# Patient Record
Sex: Male | Born: 1975 | Race: White | Hispanic: No | Smoking: Never smoker
Health system: Southern US, Community
[De-identification: ages and names within clinical notes are randomized; demographics above are authoritative.]

## PROBLEM LIST (undated history)

## (undated) DIAGNOSIS — M1990 Unspecified osteoarthritis, unspecified site: Secondary | ICD-10-CM

## (undated) DIAGNOSIS — E119 Type 2 diabetes mellitus without complications: Secondary | ICD-10-CM

## (undated) DIAGNOSIS — E785 Hyperlipidemia, unspecified: Secondary | ICD-10-CM

## (undated) DIAGNOSIS — I10 Essential (primary) hypertension: Secondary | ICD-10-CM

## (undated) DIAGNOSIS — N201 Calculus of ureter: Secondary | ICD-10-CM

## (undated) DIAGNOSIS — Z87442 Personal history of urinary calculi: Secondary | ICD-10-CM

## (undated) DIAGNOSIS — Z9989 Dependence on other enabling machines and devices: Secondary | ICD-10-CM

## (undated) DIAGNOSIS — G4733 Obstructive sleep apnea (adult) (pediatric): Secondary | ICD-10-CM

## (undated) DIAGNOSIS — Z973 Presence of spectacles and contact lenses: Secondary | ICD-10-CM

## (undated) HISTORY — PX: OTHER SURGICAL HISTORY: SHX169

---

## 2004-08-18 HISTORY — PX: OTHER SURGICAL HISTORY: SHX169

## 2010-07-12 ENCOUNTER — Encounter: Admission: RE | Admit: 2010-07-12 | Discharge: 2010-07-12 | Payer: Self-pay | Admitting: Neurosurgery

## 2010-08-14 ENCOUNTER — Encounter
Admission: RE | Admit: 2010-08-14 | Discharge: 2010-08-14 | Payer: Self-pay | Source: Home / Self Care | Attending: Neurosurgery | Admitting: Neurosurgery

## 2010-12-19 ENCOUNTER — Other Ambulatory Visit: Payer: Self-pay | Admitting: Neurosurgery

## 2010-12-19 DIAGNOSIS — M5410 Radiculopathy, site unspecified: Secondary | ICD-10-CM

## 2010-12-19 DIAGNOSIS — M5126 Other intervertebral disc displacement, lumbar region: Secondary | ICD-10-CM

## 2010-12-19 DIAGNOSIS — M549 Dorsalgia, unspecified: Secondary | ICD-10-CM

## 2010-12-23 ENCOUNTER — Ambulatory Visit
Admission: RE | Admit: 2010-12-23 | Discharge: 2010-12-23 | Disposition: A | Discharge status: Home / Self Care | Payer: BC Managed Care – PPO | Source: Ambulatory Visit | Attending: Neurosurgery | Admitting: Neurosurgery

## 2010-12-23 DIAGNOSIS — M5410 Radiculopathy, site unspecified: Secondary | ICD-10-CM

## 2010-12-23 DIAGNOSIS — M5126 Other intervertebral disc displacement, lumbar region: Secondary | ICD-10-CM

## 2010-12-23 DIAGNOSIS — M549 Dorsalgia, unspecified: Secondary | ICD-10-CM

## 2011-12-17 ENCOUNTER — Other Ambulatory Visit: Payer: Self-pay | Admitting: Neurosurgery

## 2011-12-17 DIAGNOSIS — M549 Dorsalgia, unspecified: Secondary | ICD-10-CM

## 2011-12-26 ENCOUNTER — Ambulatory Visit
Admission: RE | Admit: 2011-12-26 | Discharge: 2011-12-26 | Disposition: A | Discharge status: Home / Self Care | Payer: Managed Care, Other (non HMO) | Source: Ambulatory Visit | Attending: Neurosurgery | Admitting: Neurosurgery

## 2011-12-26 VITALS — BP 152/84 | HR 74

## 2011-12-26 DIAGNOSIS — M549 Dorsalgia, unspecified: Secondary | ICD-10-CM

## 2011-12-26 MED ORDER — IOHEXOL 180 MG/ML  SOLN
1.0000 mL | Freq: Once | INTRAMUSCULAR | Status: AC | PRN
  Administered 2011-12-26: 1 mL via EPIDURAL

## 2011-12-26 MED ORDER — METHYLPREDNISOLONE ACETATE 40 MG/ML INJ SUSP (RADIOLOG
120.0000 mg | Freq: Once | INTRAMUSCULAR | Status: AC
  Administered 2011-12-26: 120 mg via EPIDURAL

## 2011-12-26 NOTE — Discharge Instructions (Signed)

## 2013-05-13 ENCOUNTER — Ambulatory Visit (INDEPENDENT_AMBULATORY_CARE_PROVIDER_SITE_OTHER): Payer: Managed Care, Other (non HMO) | Admitting: Urology

## 2013-05-13 ENCOUNTER — Other Ambulatory Visit: Payer: Self-pay | Admitting: Urology

## 2013-05-13 DIAGNOSIS — N200 Calculus of kidney: Secondary | ICD-10-CM

## 2013-06-10 ENCOUNTER — Encounter (HOSPITAL_BASED_OUTPATIENT_CLINIC_OR_DEPARTMENT_OTHER): Payer: Self-pay | Admitting: *Deleted

## 2013-06-10 NOTE — Progress Notes (Addendum)
NPO AFTER MN. ARRIVE AT 0600. NEEDS KUB, EKG, AND ISTAT. WILL TAKE NORVASC AM DOS W/ SIPS OF WATER.  SPOKE W/ PT VIA PHONE. PT WAS NOTIFIED BY PAM AT DR [facility] OFFICE ABOUT SURGERY TIME  CHANGE TO 1500.  PT TO ARRIVE AT 1300. NPO AFTER MN WITH EXCEPTION CLEAR LIQUIDS UNTIL 0900 (NO CREAM/ MILK PRODUCTS).  SAME NEEDS AS ABOVE AND PT WILL TAKE NORVASC.

## 2013-06-15 NOTE — H&P (Signed)
ctive Problems Problems  1. Nephrolithiasis 592.0  History of Present Illness     Howard Rogers is a 37 yo WM who has a 5 mm left ureteral stone that presented at the end of July.   He had a stent placed then and was to have ESWL but because of Dr. Rudean Haskell schedule, it hasn't been done yet.   He is tolerating the stent well but has some bleeding at times.  He has had no prior stones.  He has no other GU history.   Past Medical History Problems  1. History of  Adult Sleep Apnea 780.57 2. History of  Diabetes Mellitus 250.00 3. History of  Hypercholesterolemia 272.0 4. History of  Hypertension 401.9  Surgical History Problems  1. History of  Cystoscopy With Insertion Of Ureteral Stent Left 2. History of  Eye Surgery  Current Meds 1. Amlodipine Besy-Benazepril HCl 10-20 MG Oral Capsule; Therapy: (Recorded:26Sep2014) to 2. Glimepiride 4 MG Oral Tablet; Therapy: (Recorded:26Sep2014) to 3. MetFORMIN HCl 1000 MG Oral Tablet; Therapy: (Recorded:26Sep2014) to 4. Simvastatin 40 MG Oral Tablet; Therapy: (Recorded:26Sep2014) to 5. Victoza 18 MG/3ML Subcutaneous Solution Pen-injector; Therapy: (Recorded:26Sep2014) to  Allergies Medication  1. Codeine Derivatives  Family History Problems  1. Family history of  Cancer 2. Family history of  Death In The Family Father prostate ca 3. Family history of  Diabetes Mellitus V18.0 4. 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  Genitourinary: urinary frequency, dysuria, nocturia and hematuria.  Gastrointestinal: heartburn and diarrhea.   Constitutional: feeling tired (fatigue).    Vitals Vital Signs [Data Includes: Last 1 Day]  26Sep2014 08:32AM  BMI Calculated: 37.93 BSA Calculated: 2.46 Height: 6 ft  Weight: 280 lb  Blood Pressure: 114 / 73 Temperature: 98.8 F Heart Rate: 80  Physical Exam Constitutional: Well nourished and well developed . No acute distress.  ENT:. The ears and nose are normal in appearance.  Pulmonary: No respiratory distress and normal respiratory rhythm and effort.  Cardiovascular: Heart rate and rhythm are normal . No peripheral edema.  Abdomen: The abdomen is obese. The abdomen is soft and nontender. No masses are palpated. No CVA tenderness. No hernias are palpable. No hepatosplenomegaly noted.  Lymphatics: The posterior cervical and supraclavicular nodes are not enlarged or tender.  Skin: Normal skin turgor, no visible rash and no visible skin lesions.  Neuro/Psych:. Mood and affect are appropriate. No motor deficits.    Results/Data  Old records or history reviewed: I have reviewed hospital and office records from Dr. Jerre Simon.  The following images/tracing/specimen were independently visualized:  I have reviewed his films from [place].  The following clinical lab reports were reviewed:  UA reviewed and it was nitrite negative with 3-6 WBC's and 11-20 RBC's.    Assessment Assessed  1. Nephrolithiasis 592.0   He has a left renal stone post stenting and needs definitive therapy.   Plan Nephrolithiasis (592.0)  1. UA With REFLEX  Requested for: 23Sep2014 2. Follow-up Schedule Surgery Office  Follow-up  Requested for: 26Sep2014      I discussed the options including stent removal with possible passage, ESWL and ureteroscopic extraction.   He would like to  proceed with ureteroscopy for the left renal stone and I reviewed the risks of bleeding, infection, ureteral injury, need for a stent or secondary procedures, thrombotic events and anesthetic complications. I asked him to hydrate  well and work on his weight which will help reduce the risk of stones and improve his other medical conditions.   Discussion/Summary  CC: Dr. Lia Hopping.

## 2013-06-16 ENCOUNTER — Ambulatory Visit (HOSPITAL_BASED_OUTPATIENT_CLINIC_OR_DEPARTMENT_OTHER)
Admission: RE | Admit: 2013-06-16 | Discharge: 2013-06-16 | Disposition: A | Discharge status: Home / Self Care | Payer: Managed Care, Other (non HMO) | Source: Ambulatory Visit | Attending: Urology | Admitting: Urology

## 2013-06-16 ENCOUNTER — Ambulatory Visit (HOSPITAL_BASED_OUTPATIENT_CLINIC_OR_DEPARTMENT_OTHER): Payer: Managed Care, Other (non HMO) | Admitting: Anesthesiology

## 2013-06-16 ENCOUNTER — Encounter (HOSPITAL_BASED_OUTPATIENT_CLINIC_OR_DEPARTMENT_OTHER): Payer: Self-pay | Admitting: *Deleted

## 2013-06-16 ENCOUNTER — Encounter (HOSPITAL_BASED_OUTPATIENT_CLINIC_OR_DEPARTMENT_OTHER): Payer: Managed Care, Other (non HMO) | Admitting: Anesthesiology

## 2013-06-16 ENCOUNTER — Ambulatory Visit (HOSPITAL_COMMUNITY): Payer: Managed Care, Other (non HMO)

## 2013-06-16 ENCOUNTER — Encounter (HOSPITAL_BASED_OUTPATIENT_CLINIC_OR_DEPARTMENT_OTHER)
Admission: RE | Disposition: A | Discharge status: Home / Self Care | Payer: Self-pay | Source: Ambulatory Visit | Attending: Urology

## 2013-06-16 DIAGNOSIS — K219 Gastro-esophageal reflux disease without esophagitis: Secondary | ICD-10-CM | POA: Insufficient documentation

## 2013-06-16 DIAGNOSIS — I10 Essential (primary) hypertension: Secondary | ICD-10-CM | POA: Insufficient documentation

## 2013-06-16 DIAGNOSIS — E119 Type 2 diabetes mellitus without complications: Secondary | ICD-10-CM | POA: Insufficient documentation

## 2013-06-16 DIAGNOSIS — N200 Calculus of kidney: Secondary | ICD-10-CM | POA: Insufficient documentation

## 2013-06-16 DIAGNOSIS — G4730 Sleep apnea, unspecified: Secondary | ICD-10-CM | POA: Insufficient documentation

## 2013-06-16 DIAGNOSIS — E7800 Pure hypercholesterolemia, unspecified: Secondary | ICD-10-CM | POA: Insufficient documentation

## 2013-06-16 HISTORY — PX: URETEROSCOPY: SHX842

## 2013-06-16 HISTORY — DX: Unspecified osteoarthritis, unspecified site: M19.90

## 2013-06-16 HISTORY — DX: Type 2 diabetes mellitus without complications: E11.9

## 2013-06-16 HISTORY — DX: Hyperlipidemia, unspecified: E78.5

## 2013-06-16 HISTORY — PX: HOLMIUM LASER APPLICATION: SHX5852

## 2013-06-16 HISTORY — DX: Essential (primary) hypertension: I10

## 2013-06-16 HISTORY — DX: Presence of spectacles and contact lenses: Z97.3

## 2013-06-16 HISTORY — DX: Dependence on other enabling machines and devices: Z99.89

## 2013-06-16 HISTORY — DX: Obstructive sleep apnea (adult) (pediatric): G47.33

## 2013-06-16 LAB — GLUCOSE, CAPILLARY: Glucose-Capillary: 134 mg/dL — ABNORMAL HIGH (ref 70–99)

## 2013-06-16 LAB — POCT I-STAT 4, (NA,K, GLUC, HGB,HCT)
Glucose, Bld: 116 mg/dL — ABNORMAL HIGH (ref 70–99)
Potassium: 3.9 mEq/L (ref 3.5–5.1)

## 2013-06-16 SURGERY — URETEROSCOPY
Anesthesia: General | Site: Ureter | Laterality: Left | Wound class: Clean Contaminated

## 2013-06-16 MED ORDER — ONDANSETRON HCL 4 MG/2ML IJ SOLN
4.0000 mg | Freq: Four times a day (QID) | INTRAMUSCULAR | Status: DC | PRN
  Filled 2013-06-16: qty 2

## 2013-06-16 MED ORDER — PROMETHAZINE HCL 25 MG/ML IJ SOLN
6.2500 mg | INTRAMUSCULAR | Status: DC | PRN
  Filled 2013-06-16: qty 1

## 2013-06-16 MED ORDER — CIPROFLOXACIN IN D5W 400 MG/200ML IV SOLN
400.0000 mg | INTRAVENOUS | Status: AC
  Administered 2013-06-16: 400 mg via INTRAVENOUS
  Filled 2013-06-16: qty 200

## 2013-06-16 MED ORDER — HYDROMORPHONE HCL 2 MG PO TABS: 2.0000 mg | ORAL_TABLET | ORAL | Status: DC | PRN

## 2013-06-16 MED ORDER — PROPOFOL 10 MG/ML IV BOLUS
INTRAVENOUS | Status: DC | PRN
  Administered 2013-06-16: 300 mg via INTRAVENOUS

## 2013-06-16 MED ORDER — MIDAZOLAM HCL 5 MG/5ML IJ SOLN
INTRAMUSCULAR | Status: DC | PRN
  Administered 2013-06-16 (×2): 1 mg via INTRAVENOUS

## 2013-06-16 MED ORDER — LACTATED RINGERS IV SOLN
INTRAVENOUS | Status: DC
  Administered 2013-06-16: 14:00:00 via INTRAVENOUS
  Filled 2013-06-16: qty 1000

## 2013-06-16 MED ORDER — 0.9 % SODIUM CHLORIDE (POUR BTL) OPTIME
TOPICAL | Status: DC | PRN
  Administered 2013-06-16: 1000 mL

## 2013-06-16 MED ORDER — SODIUM CHLORIDE 0.9 % IJ SOLN
3.0000 mL | INTRAMUSCULAR | Status: DC | PRN
  Filled 2013-06-16: qty 3

## 2013-06-16 MED ORDER — KETOROLAC TROMETHAMINE 30 MG/ML IJ SOLN
INTRAMUSCULAR | Status: DC | PRN
  Administered 2013-06-16: 30 mg via INTRAVENOUS

## 2013-06-16 MED ORDER — ONDANSETRON HCL 4 MG/2ML IJ SOLN
INTRAMUSCULAR | Status: DC | PRN
  Administered 2013-06-16: 4 mg via INTRAVENOUS

## 2013-06-16 MED ORDER — SODIUM CHLORIDE 0.9 % IV SOLN
250.0000 mL | INTRAVENOUS | Status: DC | PRN
  Filled 2013-06-16: qty 250

## 2013-06-16 MED ORDER — ACETAMINOPHEN 325 MG PO TABS
650.0000 mg | ORAL_TABLET | ORAL | Status: DC | PRN
  Filled 2013-06-16: qty 2

## 2013-06-16 MED ORDER — LIDOCAINE HCL (CARDIAC) 20 MG/ML IV SOLN
INTRAVENOUS | Status: DC | PRN
  Administered 2013-06-16: 100 mg via INTRAVENOUS

## 2013-06-16 MED ORDER — BELLADONNA ALKALOIDS-OPIUM 16.2-60 MG RE SUPP
RECTAL | Status: DC | PRN
  Administered 2013-06-16: 1 via RECTAL

## 2013-06-16 MED ORDER — FENTANYL CITRATE 0.05 MG/ML IJ SOLN
25.0000 ug | INTRAMUSCULAR | Status: DC | PRN
  Filled 2013-06-16: qty 1

## 2013-06-16 MED ORDER — FENTANYL CITRATE 0.05 MG/ML IJ SOLN
INTRAMUSCULAR | Status: DC | PRN
  Administered 2013-06-16: 50 ug via INTRAVENOUS
  Administered 2013-06-16 (×6): 25 ug via INTRAVENOUS

## 2013-06-16 MED ORDER — LACTATED RINGERS IV SOLN
INTRAVENOUS | Status: DC | PRN
  Administered 2013-06-16 (×2): via INTRAVENOUS

## 2013-06-16 MED ORDER — ACETAMINOPHEN 650 MG RE SUPP
650.0000 mg | RECTAL | Status: DC | PRN
  Filled 2013-06-16: qty 1

## 2013-06-16 MED ORDER — OXYCODONE HCL 5 MG PO TABS
5.0000 mg | ORAL_TABLET | ORAL | Status: DC | PRN
  Filled 2013-06-16: qty 2

## 2013-06-16 MED ORDER — SODIUM CHLORIDE 0.9 % IJ SOLN
3.0000 mL | Freq: Two times a day (BID) | INTRAMUSCULAR | Status: DC
  Filled 2013-06-16: qty 3

## 2013-06-16 MED ORDER — SODIUM CHLORIDE 0.9 % IR SOLN
Status: DC | PRN
  Administered 2013-06-16: 6000 mL

## 2013-06-16 SURGICAL SUPPLY — 36 items
BAG DRAIN URO-CYSTO SKYTR STRL (DRAIN) ×2 IMPLANT
BASKET LASER NITINOL 1.9FR (BASKET) IMPLANT
BASKET STNLS GEMINI 4WIRE 3FR (BASKET) IMPLANT
BASKET ZERO TIP NITINOL 2.4FR (BASKET) ×4 IMPLANT
BRUSH URET BIOPSY 3F (UROLOGICAL SUPPLIES) IMPLANT
CANISTER SUCT LVC 12 LTR MEDI- (MISCELLANEOUS) ×2 IMPLANT
CATH URET 5FR 28IN CONE TIP (BALLOONS)
CATH URET 5FR 28IN OPEN ENDED (CATHETERS) ×2 IMPLANT
CATH URET 5FR 70CM CONE TIP (BALLOONS) IMPLANT
CLOTH BEACON ORANGE TIMEOUT ST (SAFETY) ×2 IMPLANT
DRAPE CAMERA CLOSED 9X96 (DRAPES) ×2 IMPLANT
ELECT REM PT RETURN 9FT ADLT (ELECTROSURGICAL)
ELECTRODE REM PT RTRN 9FT ADLT (ELECTROSURGICAL) IMPLANT
FIBER LASER FLEXIVA 1000 (UROLOGICAL SUPPLIES) IMPLANT
FIBER LASER FLEXIVA 200 (UROLOGICAL SUPPLIES) ×2 IMPLANT
FIBER LASER FLEXIVA 365 (UROLOGICAL SUPPLIES) IMPLANT
FIBER LASER FLEXIVA 550 (UROLOGICAL SUPPLIES) IMPLANT
GLOVE BIOGEL PI IND STRL 7.5 (GLOVE) ×3 IMPLANT
GLOVE BIOGEL PI INDICATOR 7.5 (GLOVE) ×3
GLOVE SURG SS PI 8.0 STRL IVOR (GLOVE) ×2 IMPLANT
GOWN STRL NON-REIN LRG LVL3 (GOWN DISPOSABLE) ×4 IMPLANT
GOWN STRL REIN XL XLG (GOWN DISPOSABLE) ×2 IMPLANT
GUIDEWIRE 0.038 PTFE COATED (WIRE) IMPLANT
GUIDEWIRE ANG ZIPWIRE 038X150 (WIRE) IMPLANT
GUIDEWIRE STR DUAL SENSOR (WIRE) ×2 IMPLANT
IV NS IRRIG 3000ML ARTHROMATIC (IV SOLUTION) ×4 IMPLANT
KIT BALLIN UROMAX 15FX10 (LABEL) IMPLANT
KIT BALLN UROMAX 15FX4 (MISCELLANEOUS) IMPLANT
KIT BALLN UROMAX 26 75X4 (MISCELLANEOUS)
NS IRRIG 500ML POUR BTL (IV SOLUTION) ×2 IMPLANT
OMNIPAQUE IMPLANT
PACK CYSTOSCOPY (CUSTOM PROCEDURE TRAY) ×2 IMPLANT
SET HIGH PRES BAL DIL (LABEL)
SHEATH ACCESS URETERAL 38CM (SHEATH) ×2 IMPLANT
SHEATH ACCESS URETERAL 54CM (SHEATH) IMPLANT
STENT URET 6FRX26 CONTOUR (STENTS) ×2 IMPLANT

## 2013-06-16 NOTE — Anesthesia Preprocedure Evaluation (Addendum)
Anesthesia Evaluation  Patient identified by MRN, date of birth, ID band Patient awake    Reviewed: Allergy & Precautions, H&P , NPO status , Patient's Chart, lab work & pertinent test results  Airway Mallampati: II TM Distance: >3 FB Neck ROM: Full    Dental no notable dental hx.    Pulmonary sleep apnea and Continuous Positive Airway Pressure Ventilation ,  breath sounds clear to auscultation  Pulmonary exam normal       Cardiovascular Exercise Tolerance: Good hypertension, Pt. on medications negative cardio ROS  Rhythm:Regular Rate:Normal     Neuro/Psych negative neurological ROS  negative psych ROS   GI/Hepatic negative GI ROS, Neg liver ROS, GERD-  Medicated,  Endo/Other  diabetes, Type 2, Oral Hypoglycemic Agents  Renal/GU Renal diseasenegative Renal ROS  negative genitourinary   Musculoskeletal negative musculoskeletal ROS (+)   Abdominal (+) + obese,   Peds negative pediatric ROS (+)  Hematology negative hematology ROS (+)   Anesthesia Other Findings   Reproductive/Obstetrics negative OB ROS                          Anesthesia Physical Anesthesia Plan  ASA: III  Anesthesia Plan: General   Post-op Pain Management:    Induction: Intravenous  Airway Management Planned: LMA  Additional Equipment:   Intra-op Plan:   Post-operative Plan: Extubation in OR  Informed Consent: I have reviewed the patients History and Physical, chart, labs and discussed the procedure including the risks, benefits and alternatives for the proposed anesthesia with the patient or authorized representative who has indicated his/her understanding and acceptance.   Dental advisory given  Plan Discussed with: CRNA  Anesthesia Plan Comments: (Outpatient surgery in [place] in July and did fine. He has been on CPAP for about six weeks now with good result.)      Anesthesia Quick Evaluation

## 2013-06-16 NOTE — OR Nursing (Signed)
At 1532 left stent removed.

## 2013-06-16 NOTE — Brief Op Note (Signed)
06/16/2013  4:24 PM  PATIENT:  Sherron Ales  37 y.o. male  PRE-OPERATIVE DIAGNOSIS:  Left Renal Stone  POST-OPERATIVE DIAGNOSIS:  Left Renal Stone  PROCEDURE:  Procedure(s): URETEROSCOPY, LEFT URETERAL STENT REMOVAL, LEFT URETERAL STONE EXTRACTION WITH NITINOL BASKET (Left)  LEFT STONE HOLMIUM LASER APPLICATION (Left)  SURGEON:  Surgeon(s) and Role:    * Bjorn Pippin, MD - Primary  PHYSICIAN ASSISTANT:   ASSISTANTS: none   ANESTHESIA:   general  EBL:  Total I/O In: 1000 [I.V.:1000] Out: -   BLOOD ADMINISTERED:none  DRAINS: left 6x26jj stent   LOCAL MEDICATIONS USED:  NONE  SPECIMEN:  Source of Specimen:  stone fragments  DISPOSITION OF SPECIMEN:  to family  COUNTS:  YES  TOURNIQUET:  * No tourniquets in log *  DICTATION: .Other Dictation: Dictation Number [id number]  PLAN OF CARE: Discharge to home after PACU  PATIENT DISPOSITION:  PACU - hemodynamically stable.   Delay start of Pharmacological VTE agent (>24hrs) due to surgical blood loss or risk of bleeding: not applicable

## 2013-06-16 NOTE — OR Nursing (Signed)
Left ureteral stone taking by Dr. Annabell Howells.

## 2013-06-16 NOTE — Anesthesia Procedure Notes (Addendum)
Procedure Name: LMA Insertion Date/Time: 06/16/2013 3:22 PM Performed by: Jessica Priest Pre-anesthesia Checklist: Patient identified, Emergency Drugs available, Suction available and Patient being monitored Patient Re-evaluated:Patient Re-evaluated prior to inductionOxygen Delivery Method: Circle System Utilized Preoxygenation: Pre-oxygenation with 100% oxygen Intubation Type: IV induction Ventilation: Mask ventilation without difficulty LMA: LMA with gastric port inserted LMA Size: 5.0 Number of attempts: 1 Placement Confirmation: positive ETCO2 Tube secured with: Tape Dental Injury: Teeth and Oropharynx as per pre-operative assessment     Oral bite block placed on right side mouth - noted small sore on bottom lip ( as preop status) - LB

## 2013-06-16 NOTE — Transfer of Care (Signed)
Immediate Anesthesia Transfer of Care Note  Patient: Gibran Veselka  Procedure(s) Performed: Procedure(s) (LRB): URETEROSCOPY, LEFT URETERAL STENT REMOVAL, LEFT URETERAL STONE EXTRACTION WITH NITINOL BASKET (Left)  LEFT STONE HOLMIUM LASER APPLICATION (Left)  Patient Location: PACU  Anesthesia Type: General  Level of Consciousness: awake, sedated, patient cooperative and responds to stimulation  Airway & Oxygen Therapy: Patient Spontanous Breathing and Patient connected to face mask oxygen  Post-op Assessment: Report given to PACU RN, Post -op Vital signs reviewed and stable and Patient moving all extremities  Post vital signs: Reviewed and stable  Complications: No apparent anesthesia complications

## 2013-06-16 NOTE — Interval H&P Note (Signed)
History and Physical Interval Note:  06/16/2013 3:06 PM  Howard Rogers  has presented today for surgery, with the diagnosis of Left Renal Stone  The various methods of treatment have been discussed with the patient and family. After consideration of risks, benefits and other options for treatment, the patient has consented to  Procedure(s): URETEROSCOPY (Left) HOLMIUM LASER APPLICATION (Left) as a surgical intervention .  The patient's history has been reviewed, patient examined, no change in status, stable for surgery.  I have reviewed the patient's chart and labs.  Questions were answered to the patient's satisfaction.     Oluwakemi Salsberry J

## 2013-06-17 NOTE — Op Note (Signed)
NAME:  Howard Rogers, Howard Rogers                  ACCOUNT NO.:  MEDICAL RECORD NO.:  [id number]  LOCATION:                                 FACILITY:  PHYSICIAN:  Excell Seltzer. Annabell Howells, M.D.         DATE OF BIRTH:  DATE OF PROCEDURE:  06/16/2013 DATE OF DISCHARGE:                              OPERATIVE REPORT   PROCEDURE:  Left ureteroscopic stone extraction with holmium lasertripsy placed on left double-J stent.  PREOPERATIVE DIAGNOSIS:  A 5-mm left renal stone.  POSTOPERATIVE DIAGNOSIS:  holmium laser trips.  SURGEON:  Excell Seltzer. Annabell Howells, MD  ANESTHESIA:  General.  SPECIMEN:  Stone fragments.  DRAINS:  A 6-French 26-cm double-J stent with string.  BLOOD LOSS:  Minimal.  COMPLICATIONS:  None.  INDICATIONS:  Howard Rogers is a 37 year old white male, who recently had a 5-mm proximal stone and underwent stenting in July.  He now presents for definitive ureteroscopy.  FINDINGS OF PROCEDURES:  He was given Cipro.  He was taken to the operating room where general anesthetic was induced.  He was placed in lithotomy position and fitted with PAS hose.  Perineum and genitalia were prepped with Betadine solution and draped in usual sterile fashion.  Cystoscopy was performed using a 22-French scope and 12-degree lens. Examination revealed a normal urethra.  The prostatic urethra was short without obstruction.  Examination of bladder revealed mild trabeculation.  No tumors or stones were noted in the left ureteral orifice.  The right ureteral orifice was normal.  The left ureteral orifice had a stent that had incrustation and moderate edema.  The stent was grasped with grasping forceps, pulled through the urethral meatus and a wire was passed.  An attempt was made to pass a wire to the kidney.  However, the stent was obstructed so it was removed and then I was able to advance the wire through the ureteral orifice to the kidney without difficulty.  Some calcifications that broke from the stent  were evacuated from the bladder.  The cystoscope was removed and a 38-cm digital access sheath was passed over the wire to the proximal ureter.  The inner core and wire were removed and a digital flexible ureteroscope was then passed to the kidney.  I was able to negotiate the scope into the lower calyx where the stone was present.  The stone was under the lip of the calyx. Multiple attempts were made to basket the stone including flushing it with a high-pressure  syringe which moved into the mid calix but it was smooth enough that it did not engage the basket.  At this point, a 200 micron laser fiber was passed and was set on 1 joule and 5 hertz and the stone was engaged and was broken into manageable fragments.  A Nitinol basket was then passed again and 2 large fragments were removed, a single small fragment remained but was inaccessible due to the location.  At this point, the ureteroscope was removed.  The wire was placed through the access sheath which was then removed and a 6-French 26 cm double-J stent with string was passed over the wire to the kidney under fluoroscopic  guidance.  The wire was removed leaving good coil in the bladder.  The stone appeared to be in the distal ureter.  The cystoscope was reinserted alongside the wire.  The string was then pulled bringing the distal loop into the bladder.  At this point, the bladder was drained.  The cystoscope was removed. The stent string was left exiting the urethra.  The patient was taken down from lithotomy position.  His anesthetic was reversed.  He was moved to recovery room in stable condition.  There were no complications.     Excell Seltzer. Annabell Howells, M.D.     JJW/MEDQ  D:  06/16/2013  T:  06/17/2013  Job:  [id number]

## 2013-06-17 NOTE — Anesthesia Postprocedure Evaluation (Signed)
  Anesthesia Post-op Note  Patient: Howard Rogers  Procedure(s) Performed: Procedure(s) (LRB): URETEROSCOPY, LEFT URETERAL STENT REMOVAL, LEFT URETERAL STONE EXTRACTION WITH NITINOL BASKET (Left)  LEFT STONE HOLMIUM LASER APPLICATION (Left)  Patient Location: PACU  Anesthesia Type: General  Level of Consciousness: awake and alert   Airway and Oxygen Therapy: Patient Spontanous Breathing  Post-op Pain: mild  Post-op Assessment: Post-op Vital signs reviewed, Patient's Cardiovascular Status Stable, Respiratory Function Stable, Patent Airway and No signs of Nausea or vomiting  Last Vitals:  Filed Vitals:   06/16/13 1837  BP: 122/85  Pulse: 77  Temp: 36.1 C  Resp: 18    Post-op Vital Signs: stable   Complications: No apparent anesthesia complications

## 2013-06-20 ENCOUNTER — Encounter (HOSPITAL_BASED_OUTPATIENT_CLINIC_OR_DEPARTMENT_OTHER): Payer: Self-pay | Admitting: Urology

## 2013-07-01 ENCOUNTER — Ambulatory Visit (INDEPENDENT_AMBULATORY_CARE_PROVIDER_SITE_OTHER): Payer: Managed Care, Other (non HMO) | Admitting: Urology

## 2013-07-01 ENCOUNTER — Encounter (INDEPENDENT_AMBULATORY_CARE_PROVIDER_SITE_OTHER): Payer: Self-pay

## 2013-07-01 DIAGNOSIS — N200 Calculus of kidney: Secondary | ICD-10-CM

## 2013-12-23 ENCOUNTER — Ambulatory Visit (HOSPITAL_COMMUNITY)
Admission: RE | Admit: 2013-12-23 | Discharge: 2013-12-23 | Disposition: A | Discharge status: Home / Self Care | Payer: Managed Care, Other (non HMO) | Source: Ambulatory Visit | Attending: Urology | Admitting: Urology

## 2013-12-23 ENCOUNTER — Other Ambulatory Visit: Payer: Self-pay | Admitting: Urology

## 2013-12-23 DIAGNOSIS — N200 Calculus of kidney: Secondary | ICD-10-CM

## 2013-12-30 ENCOUNTER — Ambulatory Visit (INDEPENDENT_AMBULATORY_CARE_PROVIDER_SITE_OTHER): Payer: Managed Care, Other (non HMO) | Admitting: Urology

## 2013-12-30 DIAGNOSIS — N200 Calculus of kidney: Secondary | ICD-10-CM

## 2016-02-15 ENCOUNTER — Other Ambulatory Visit: Payer: Self-pay | Admitting: Internal Medicine

## 2016-02-15 DIAGNOSIS — M545 Low back pain, unspecified: Secondary | ICD-10-CM

## 2016-02-15 DIAGNOSIS — G8929 Other chronic pain: Secondary | ICD-10-CM

## 2016-02-20 ENCOUNTER — Ambulatory Visit
Admission: RE | Admit: 2016-02-20 | Discharge: 2016-02-20 | Disposition: A | Discharge status: Home / Self Care | Payer: 59 | Source: Ambulatory Visit | Attending: Internal Medicine | Admitting: Internal Medicine

## 2016-02-20 DIAGNOSIS — M545 Low back pain, unspecified: Secondary | ICD-10-CM

## 2016-02-20 DIAGNOSIS — G8929 Other chronic pain: Secondary | ICD-10-CM

## 2016-02-20 MED ORDER — IOPAMIDOL (ISOVUE-M 200) INJECTION 41%
1.0000 mL | Freq: Once | INTRAMUSCULAR | Status: AC
  Administered 2016-02-20: 1 mL via EPIDURAL

## 2016-02-20 MED ORDER — METHYLPREDNISOLONE ACETATE 40 MG/ML INJ SUSP (RADIOLOG
120.0000 mg | Freq: Once | INTRAMUSCULAR | Status: AC
  Administered 2016-02-20: 120 mg via EPIDURAL

## 2016-02-20 NOTE — Discharge Instructions (Signed)

## 2016-04-11 ENCOUNTER — Other Ambulatory Visit: Payer: Self-pay | Admitting: Urology

## 2016-04-11 ENCOUNTER — Ambulatory Visit (HOSPITAL_COMMUNITY)
Admission: RE | Admit: 2016-04-11 | Discharge: 2016-04-11 | Disposition: A | Discharge status: Home / Self Care | Payer: 59 | Source: Ambulatory Visit | Attending: Urology | Admitting: Urology

## 2016-04-11 ENCOUNTER — Ambulatory Visit (INDEPENDENT_AMBULATORY_CARE_PROVIDER_SITE_OTHER): Payer: 59 | Admitting: Urology

## 2016-04-11 DIAGNOSIS — N200 Calculus of kidney: Secondary | ICD-10-CM

## 2016-04-11 DIAGNOSIS — N132 Hydronephrosis with renal and ureteral calculous obstruction: Secondary | ICD-10-CM | POA: Diagnosis not present

## 2016-04-11 DIAGNOSIS — N281 Cyst of kidney, acquired: Secondary | ICD-10-CM | POA: Diagnosis not present

## 2016-05-02 ENCOUNTER — Ambulatory Visit (INDEPENDENT_AMBULATORY_CARE_PROVIDER_SITE_OTHER): Payer: 59 | Admitting: Urology

## 2016-05-02 ENCOUNTER — Other Ambulatory Visit: Payer: Self-pay | Admitting: Urology

## 2016-05-02 ENCOUNTER — Ambulatory Visit (HOSPITAL_COMMUNITY)
Admission: RE | Admit: 2016-05-02 | Discharge: 2016-05-02 | Disposition: A | Discharge status: Home / Self Care | Payer: 59 | Source: Ambulatory Visit | Attending: Urology | Admitting: Urology

## 2016-05-02 DIAGNOSIS — N201 Calculus of ureter: Secondary | ICD-10-CM | POA: Insufficient documentation

## 2016-05-02 DIAGNOSIS — N200 Calculus of kidney: Secondary | ICD-10-CM

## 2016-05-02 DIAGNOSIS — N281 Cyst of kidney, acquired: Secondary | ICD-10-CM | POA: Insufficient documentation

## 2016-05-02 DIAGNOSIS — K760 Fatty (change of) liver, not elsewhere classified: Secondary | ICD-10-CM | POA: Diagnosis not present

## 2016-05-02 DIAGNOSIS — N132 Hydronephrosis with renal and ureteral calculous obstruction: Secondary | ICD-10-CM | POA: Diagnosis not present

## 2016-05-09 ENCOUNTER — Other Ambulatory Visit: Payer: Self-pay | Admitting: Urology

## 2016-06-12 ENCOUNTER — Encounter (HOSPITAL_BASED_OUTPATIENT_CLINIC_OR_DEPARTMENT_OTHER): Payer: Self-pay | Admitting: *Deleted

## 2016-06-12 NOTE — Progress Notes (Addendum)
NPO AFTER MN.  ARRIVE AT 0600.  NEEDS ISTAT AND EKG.  WILL TAKE NORVASC AM DOS W/ SIPS OF WATER. WILL BRING CPAP.

## 2016-06-16 NOTE — Anesthesia Preprocedure Evaluation (Addendum)
Anesthesia Evaluation  Patient identified by MRN, date of birth, ID band Patient awake    Reviewed: Allergy & Precautions, H&P , Patient's Chart, lab work & pertinent test results, reviewed documented beta blocker date and time   Airway Mallampati: III  TM Distance: >3 FB Neck ROM: full    Dental no notable dental hx.    Pulmonary sleep apnea ,    Pulmonary exam normal breath sounds clear to auscultation       Cardiovascular hypertension,  Rhythm:regular Rate:Normal     Neuro/Psych    GI/Hepatic   Endo/Other  diabetes, Type 2, Oral Hypoglycemic Agents  Renal/GU      Musculoskeletal  (+) Arthritis , Osteoarthritis,    Abdominal   Peds  Hematology   Anesthesia Other Findings Last GA- Ventilation: Mask ventilation without difficulty LMA: LMA with gastric port inserted LMA Size: 5.0  Reproductive/Obstetrics                           Anesthesia Physical Anesthesia Plan  ASA: III  Anesthesia Plan:    Post-op Pain Management:    Induction: Intravenous  Airway Management Planned: LMA  Additional Equipment:   Intra-op Plan:   Post-operative Plan:   Informed Consent: I have reviewed the patients History and Physical, chart, labs and discussed the procedure including the risks, benefits and alternatives for the proposed anesthesia with the patient or authorized representative who has indicated his/her understanding and acceptance.   Dental Advisory Given and Dental advisory given  Plan Discussed with: CRNA and Surgeon  Anesthesia Plan Comments: (Discussed GA with LMA, possible sore throat, potential need to switch to ETT, N/V, pulmonary aspiration. Questions answered. )        Anesthesia Quick Evaluation

## 2016-06-16 NOTE — H&P (Signed)
CC: I have kidney stones.  HPI: Howard Rogers is a 40 year-old male established patient who is here for renal calculi.    Suezanne Jacquet returns today in f/u. He has a history of a right renal stone. He has had no pain over the last several weeks but a renal US showed residual hydro on 04/11/16 but a stone was not seen on KUB. His UA is clear today. He has no voiding complaints.      ALLERGIES: Codeine Derivatives    MEDICATIONS: Amlodipine Besy-Benazepril HCl - 10-20 MG Oral Capsule Oral  Glimepiride 4 MG Oral Tablet Oral  MetFORMIN HCl - 1000 MG Oral Tablet Oral  Simvastatin 40 MG Oral Tablet Oral  Victoza 18 MG/3ML Subcutaneous Solution Pen-injector Subcutaneous     GU PSH: Cysto Uretero Lithotripsy - 2014 Cystoscopy Insert Stent - 2014      PSH Notes: Cystoscopy With Insertion Of Ureteral Stent Left, Cystoscopy With Ureteroscopy With Lithotripsy, Eye Surgery   NON-GU PSH: None   GU PMH: Hydronephrosis with renal and ureteral calculous obstruction, Right, Mild to moderate right hydro about 2 weeks after the CT on MRI. - 04/11/2016 Kidney Stone, He had a 8mm mid renal stone on CT in June. - 04/11/2016, Kidney Stone, Nephrolithiasis - 2015 Renal Cysts, Simple, Right, This has been present since prior to 2014 and is only minimally increased to 5cm from 4.5cm in 2014. - 04/11/2016    NON-GU PMH: Personal history of other diseases of the circulatory system, History of hypertension - 2014 Personal history of other diseases of the nervous system and sense organs, History of sleep apnea - 2014 Personal history of other endocrine, nutritional and metabolic disease, History of hypercholesterolemia - 2014, History of diabetes mellitus, - 2014 Encounter for general adult medical examination without abnormal findings, Encounter for preventive health examination    FAMILY HISTORY: Cancer - Runs In Family Death In The Family Father - Runs In Family Diabetes - Runs In Family nephrolithiasis - Runs  In Family   SOCIAL HISTORY: None    Notes: Occupation:, Caffeine Use, Marital History - Single, Alcohol Use, Never A Smoker   REVIEW OF SYSTEMS:    GU Review Male:   Patient denies frequent urination, hard to postpone urination, burning/ pain with urination, get up at night to urinate, leakage of urine, stream starts and stops, trouble starting your stream, have to strain to urinate , erection problems, and penile pain.  Gastrointestinal (Upper):   Patient denies nausea, vomiting, and indigestion/ heartburn.  Gastrointestinal (Lower):   Patient denies constipation and diarrhea.  Constitutional:   Patient denies fever, night sweats, weight loss, and fatigue.  Skin:   Patient denies skin rash/ lesion and itching.  Eyes:   Patient denies blurred vision and double vision.  Ears/ Nose/ Throat:   Patient denies sore throat and sinus problems.  Hematologic/Lymphatic:   Patient denies swollen glands and easy bruising.  Cardiovascular:   Patient denies leg swelling and chest pains.  Respiratory:   Patient denies cough and shortness of breath.  Endocrine:   Patient denies excessive thirst.  Musculoskeletal:   Patient denies back pain and joint pain.  Neurological:   Patient denies headaches and dizziness.  Psychologic:   Patient denies depression and anxiety.   VITAL SIGNS:      05/02/2016 10:31 AM  BP 122/74 mmHg  Pulse 79 /min  Temperature 9.5 F / -13 C   PAST DATA REVIEWED:  Source Of History:  Patient  Urine Test Review:  Urinalysis  X-Ray Review: KUB: Reviewed Films. Reviewed Report.  Renal Ultrasound: Reviewed Films. Reviewed Report.  C.T. Stone Protocol: Reviewed Films. The stone has moved into the mid ureter on the right over the mid sacrum but is not obstructing.     PROCEDURES:          Urinalysis - 81003 Dipstick Dipstick Cont'd  Specimen: Voided Bilirubin: 1+  Color: Yellow Ketones: Neg  Appearance: hazy Blood: Neg  Specific Gravity: 1.020 Protein: Trace  pH: 6.0  Urobilinogen: 0.2  Glucose: Neg Nitrites: Neg    Leukocyte Esterase: Neg    ASSESSMENT:      ICD-10 Details  1 GU:   Calculus Ureter - N20.1   2   Hydronephrosis with renal and ureteral calculous obstruction - N13.2 Right     PLAN:           Orders X-Rays: C.T. Stone Protocol Without Contrast - He had residual hydro on 8/25 on [place] and needs a repeat PSA to see if the stone has passed.           Schedule Return Visit: Return PRN          Document Letter(s):  Created for Patient: Clinical Summary         Notes:   He is doing well but had hydro on the [place] and has not seen a stone pass.   I am going to get a CT stone study today and will call with the results. If there is no stone he will return prn and if there is a stone he will probably need ureteroscopy.   The CT does still show the stone in the mid ureter. It is about 44mm and is not obstructing. I will contact him to discuss treatment.

## 2016-06-17 ENCOUNTER — Ambulatory Visit (HOSPITAL_BASED_OUTPATIENT_CLINIC_OR_DEPARTMENT_OTHER): Payer: 59 | Admitting: Anesthesiology

## 2016-06-17 ENCOUNTER — Encounter (HOSPITAL_BASED_OUTPATIENT_CLINIC_OR_DEPARTMENT_OTHER): Payer: Self-pay | Admitting: *Deleted

## 2016-06-17 ENCOUNTER — Ambulatory Visit (HOSPITAL_BASED_OUTPATIENT_CLINIC_OR_DEPARTMENT_OTHER)
Admission: RE | Admit: 2016-06-17 | Discharge: 2016-06-17 | Disposition: A | Discharge status: Home / Self Care | Payer: 59 | Source: Ambulatory Visit | Attending: Urology | Admitting: Urology

## 2016-06-17 ENCOUNTER — Ambulatory Visit (HOSPITAL_COMMUNITY): Payer: 59

## 2016-06-17 ENCOUNTER — Encounter (HOSPITAL_BASED_OUTPATIENT_CLINIC_OR_DEPARTMENT_OTHER)
Admission: RE | Disposition: A | Discharge status: Home / Self Care | Payer: Self-pay | Source: Ambulatory Visit | Attending: Urology

## 2016-06-17 DIAGNOSIS — M1990 Unspecified osteoarthritis, unspecified site: Secondary | ICD-10-CM | POA: Insufficient documentation

## 2016-06-17 DIAGNOSIS — Z79899 Other long term (current) drug therapy: Secondary | ICD-10-CM | POA: Diagnosis not present

## 2016-06-17 DIAGNOSIS — Z9889 Other specified postprocedural states: Secondary | ICD-10-CM | POA: Diagnosis not present

## 2016-06-17 DIAGNOSIS — Z841 Family history of disorders of kidney and ureter: Secondary | ICD-10-CM | POA: Diagnosis not present

## 2016-06-17 DIAGNOSIS — N201 Calculus of ureter: Secondary | ICD-10-CM

## 2016-06-17 DIAGNOSIS — I10 Essential (primary) hypertension: Secondary | ICD-10-CM | POA: Insufficient documentation

## 2016-06-17 DIAGNOSIS — G4730 Sleep apnea, unspecified: Secondary | ICD-10-CM | POA: Insufficient documentation

## 2016-06-17 DIAGNOSIS — E7800 Pure hypercholesterolemia, unspecified: Secondary | ICD-10-CM | POA: Insufficient documentation

## 2016-06-17 DIAGNOSIS — Z833 Family history of diabetes mellitus: Secondary | ICD-10-CM | POA: Insufficient documentation

## 2016-06-17 DIAGNOSIS — Z794 Long term (current) use of insulin: Secondary | ICD-10-CM | POA: Insufficient documentation

## 2016-06-17 HISTORY — PX: HOLMIUM LASER APPLICATION: SHX5852

## 2016-06-17 HISTORY — DX: Calculus of ureter: N20.1

## 2016-06-17 HISTORY — DX: Personal history of urinary calculi: Z87.442

## 2016-06-17 HISTORY — PX: CYSTOSCOPY WITH RETROGRADE PYELOGRAM, URETEROSCOPY AND STENT PLACEMENT: SHX5789

## 2016-06-17 LAB — POCT I-STAT, CHEM 8
BUN: 17 mg/dL (ref 6–20)
CALCIUM ION: 1.25 mmol/L (ref 1.15–1.40)
CHLORIDE: 106 mmol/L (ref 101–111)
Creatinine, Ser: 1 mg/dL (ref 0.61–1.24)
GLUCOSE: 147 mg/dL — AB (ref 65–99)
HCT: 43 % (ref 39.0–52.0)
HEMOGLOBIN: 14.6 g/dL (ref 13.0–17.0)
POTASSIUM: 4 mmol/L (ref 3.5–5.1)
SODIUM: 140 mmol/L (ref 135–145)
TCO2: 21 mmol/L (ref 0–100)

## 2016-06-17 LAB — GLUCOSE, CAPILLARY: Glucose-Capillary: 165 mg/dL — ABNORMAL HIGH (ref 65–99)

## 2016-06-17 SURGERY — CYSTOURETEROSCOPY, WITH RETROGRADE PYELOGRAM AND STENT INSERTION
Anesthesia: General | Site: Ureter | Laterality: Right

## 2016-06-17 MED ORDER — MIDAZOLAM HCL 5 MG/5ML IJ SOLN
INTRAMUSCULAR | Status: DC | PRN
  Administered 2016-06-17: 2 mg via INTRAVENOUS

## 2016-06-17 MED ORDER — DEXTROSE 5 % IV SOLN
3.0000 g | INTRAVENOUS | Status: AC
  Administered 2016-06-17: 3 g via INTRAVENOUS
  Filled 2016-06-17: qty 3000

## 2016-06-17 MED ORDER — ACETAMINOPHEN 325 MG PO TABS
650.0000 mg | ORAL_TABLET | ORAL | Status: DC | PRN
  Filled 2016-06-17: qty 2

## 2016-06-17 MED ORDER — SODIUM CHLORIDE 0.9 % IV SOLN
250.0000 mL | INTRAVENOUS | Status: DC | PRN
  Filled 2016-06-17: qty 250

## 2016-06-17 MED ORDER — ACETAMINOPHEN 650 MG RE SUPP
650.0000 mg | RECTAL | Status: DC | PRN
  Filled 2016-06-17: qty 1

## 2016-06-17 MED ORDER — LACTATED RINGERS IV SOLN
INTRAVENOUS | Status: DC
  Administered 2016-06-17: 07:00:00 via INTRAVENOUS
  Filled 2016-06-17: qty 1000

## 2016-06-17 MED ORDER — CEFAZOLIN IN D5W 1 GM/50ML IV SOLN
INTRAVENOUS | Status: AC
  Filled 2016-06-17: qty 50

## 2016-06-17 MED ORDER — IOHEXOL 300 MG/ML  SOLN
INTRAMUSCULAR | Status: DC | PRN
  Administered 2016-06-17: 3 mL via INTRAVENOUS

## 2016-06-17 MED ORDER — FENTANYL CITRATE (PF) 100 MCG/2ML IJ SOLN
INTRAMUSCULAR | Status: AC
  Filled 2016-06-17: qty 2

## 2016-06-17 MED ORDER — LIDOCAINE 2% (20 MG/ML) 5 ML SYRINGE
INTRAMUSCULAR | Status: AC
  Filled 2016-06-17: qty 5

## 2016-06-17 MED ORDER — OXYCODONE HCL 5 MG PO TABS
5.0000 mg | ORAL_TABLET | ORAL | Status: DC | PRN
  Filled 2016-06-17: qty 2

## 2016-06-17 MED ORDER — PROPOFOL 500 MG/50ML IV EMUL
INTRAVENOUS | Status: AC
  Filled 2016-06-17: qty 50

## 2016-06-17 MED ORDER — LIDOCAINE 2% (20 MG/ML) 5 ML SYRINGE
INTRAMUSCULAR | Status: AC
  Filled 2016-06-17: qty 10

## 2016-06-17 MED ORDER — FENTANYL CITRATE (PF) 100 MCG/2ML IJ SOLN
INTRAMUSCULAR | Status: DC | PRN
  Administered 2016-06-17: 100 ug via INTRAVENOUS

## 2016-06-17 MED ORDER — BELLADONNA ALKALOIDS-OPIUM 16.2-60 MG RE SUPP
RECTAL | Status: DC | PRN
Start: 2016-06-17 — End: 2016-06-17
  Administered 2016-06-17: 1 via RECTAL

## 2016-06-17 MED ORDER — BELLADONNA ALKALOIDS-OPIUM 16.2-60 MG RE SUPP
RECTAL | Status: AC
  Filled 2016-06-17: qty 1

## 2016-06-17 MED ORDER — SODIUM CHLORIDE 0.9% FLUSH
3.0000 mL | INTRAVENOUS | Status: DC | PRN
  Filled 2016-06-17: qty 3

## 2016-06-17 MED ORDER — MIDAZOLAM HCL 2 MG/2ML IJ SOLN
INTRAMUSCULAR | Status: AC
  Filled 2016-06-17: qty 2

## 2016-06-17 MED ORDER — DEXAMETHASONE SODIUM PHOSPHATE 4 MG/ML IJ SOLN
INTRAMUSCULAR | Status: DC | PRN
  Administered 2016-06-17: 10 mg via INTRAVENOUS

## 2016-06-17 MED ORDER — SODIUM CHLORIDE 0.9 % IR SOLN
Status: DC | PRN
  Administered 2016-06-17: 3000 mL
  Administered 2016-06-17: 1000 mL

## 2016-06-17 MED ORDER — CEFAZOLIN SODIUM-DEXTROSE 2-4 GM/100ML-% IV SOLN
INTRAVENOUS | Status: AC
  Filled 2016-06-17: qty 100

## 2016-06-17 MED ORDER — PROPOFOL 10 MG/ML IV BOLUS
INTRAVENOUS | Status: DC | PRN
  Administered 2016-06-17: 250 mg via INTRAVENOUS
  Administered 2016-06-17: 50 mg via INTRAVENOUS

## 2016-06-17 MED ORDER — SODIUM CHLORIDE 0.9% FLUSH
3.0000 mL | Freq: Two times a day (BID) | INTRAVENOUS | Status: DC
  Filled 2016-06-17: qty 3

## 2016-06-17 MED ORDER — ONDANSETRON HCL 4 MG/2ML IJ SOLN
INTRAMUSCULAR | Status: DC | PRN
  Administered 2016-06-17: 4 mg via INTRAVENOUS

## 2016-06-17 MED ORDER — FENTANYL CITRATE (PF) 100 MCG/2ML IJ SOLN
25.0000 ug | INTRAMUSCULAR | Status: DC | PRN
  Filled 2016-06-17: qty 1

## 2016-06-17 MED ORDER — PHENAZOPYRIDINE HCL 200 MG PO TABS: 200.0000 mg | ORAL_TABLET | Freq: Three times a day (TID) | ORAL | 1 refills | Status: DC | PRN

## 2016-06-17 MED ORDER — LIDOCAINE 2% (20 MG/ML) 5 ML SYRINGE
INTRAMUSCULAR | Status: DC | PRN
  Administered 2016-06-17: 100 mg via INTRAVENOUS

## 2016-06-17 MED ORDER — OXYCODONE-ACETAMINOPHEN 5-325 MG PO TABS: 1.0000 | ORAL_TABLET | ORAL | 0 refills | Status: DC | PRN

## 2016-06-17 MED ORDER — LIDOCAINE HCL 2 % EX GEL
CUTANEOUS | Status: DC | PRN
  Administered 2016-06-17: 1 via URETHRAL

## 2016-06-17 SURGICAL SUPPLY — 22 items
BAG DRAIN URO-CYSTO SKYTR STRL (DRAIN) ×2 IMPLANT
BASKET LASER NITINOL 1.9FR (BASKET) IMPLANT
BASKET STONE 1.7 NGAGE (UROLOGICAL SUPPLIES) ×2 IMPLANT
BASKET ZERO TIP NITINOL 2.4FR (BASKET) IMPLANT
CATH URET 5FR 28IN OPEN ENDED (CATHETERS) ×2 IMPLANT
CLOTH BEACON ORANGE TIMEOUT ST (SAFETY) ×2 IMPLANT
FIBER LASER FLEXIVA 365 (UROLOGICAL SUPPLIES) ×2 IMPLANT
FIBER LASER TRAC TIP (UROLOGICAL SUPPLIES) IMPLANT
GLOVE SURG SS PI 8.0 STRL IVOR (GLOVE) ×2 IMPLANT
GOWN STRL REUS W/ TWL LRG LVL3 (GOWN DISPOSABLE) ×1 IMPLANT
GOWN STRL REUS W/ TWL XL LVL3 (GOWN DISPOSABLE) ×1 IMPLANT
GOWN STRL REUS W/TWL LRG LVL3 (GOWN DISPOSABLE) ×1
GOWN STRL REUS W/TWL XL LVL3 (GOWN DISPOSABLE) ×1
GUIDEWIRE STR DUAL SENSOR (WIRE) ×2 IMPLANT
IV NS 1000ML (IV SOLUTION) ×1
IV NS 1000ML BAXH (IV SOLUTION) ×1 IMPLANT
IV NS IRRIG 3000ML ARTHROMATIC (IV SOLUTION) ×2 IMPLANT
KIT ROOM TURNOVER WOR (KITS) ×2 IMPLANT
MANIFOLD NEPTUNE II (INSTRUMENTS) ×2 IMPLANT
PACK CYSTO (CUSTOM PROCEDURE TRAY) ×2 IMPLANT
SHEATH ACCESS URETERAL 38CM (SHEATH) ×2 IMPLANT
STENT URET 6FRX26 CONTOUR (STENTS) ×2 IMPLANT

## 2016-06-17 NOTE — Interval H&P Note (Signed)
History and Physical Interval Note:  He has had no pain but has seen nothing pass.   On KUB there is a suggestion of a stone in the lower right mid ureteral area.    06/17/2016 7:20 AM  Howard Rogers  has presented today for surgery, with the diagnosis of RIGHT MID URETERAL STONE  The various methods of treatment have been discussed with the patient and family. After consideration of risks, benefits and other options for treatment, the patient has consented to  Procedure(s): CYSTOSCOPY WITH RETROGRADE PYELOGRAM, URETEROSCOPY AND STENT PLACEMENT (Right) HOLMIUM LASER APPLICATION (Right) as a surgical intervention .  The patient's history has been reviewed, patient examined, no change in status, stable for surgery.  I have reviewed the patient's chart and labs.  Questions were answered to the patient's satisfaction.     [name]

## 2016-06-17 NOTE — Transfer of Care (Signed)
Immediate Anesthesia Transfer of Care Note  Patient: [name]  Procedure(s) Performed: Procedure(s): CYSTOSCOPY WITH RETROGRADE PYELOGRAM, URETEROSCOPY AND STENT PLACEMENT (Right) HOLMIUM LASER APPLICATION (Right)  Patient Location: PACU  Anesthesia Type:General  Level of Consciousness: awake, alert , oriented and patient cooperative  Airway & Oxygen Therapy: Patient Spontanous Breathing and Patient connected to nasal cannula oxygen  Post-op Assessment: Report given to RN and Post -op Vital signs reviewed and stable  Post vital signs: Reviewed and stable  Last Vitals:  Vitals:   06/17/16 0613  BP: 119/79  Pulse: 76  Resp: 18  Temp: 36.9 C    Last Pain:  Vitals:   06/17/16 0613  TempSrc: Oral      Patients Stated Pain Goal: 5 ([id number])  Complications: No apparent anesthesia complications

## 2016-06-17 NOTE — Anesthesia Postprocedure Evaluation (Signed)
Anesthesia Post Note  Patient: Howard Rogers  Procedure(s) Performed: Procedure(s) (LRB): CYSTOSCOPY WITH RETROGRADE PYELOGRAM, URETEROSCOPY AND STENT PLACEMENT (Right) HOLMIUM LASER APPLICATION (Right)  Patient location during evaluation: PACU Anesthesia Type: General Level of consciousness: sedated Pain management: satisfactory to patient Vital Signs Assessment: post-procedure vital signs reviewed and stable Respiratory status: spontaneous breathing Cardiovascular status: stable Anesthetic complications: no    Last Vitals:  Vitals:   06/17/16 0900 06/17/16 0941  BP: 92/68 137/76  Pulse: 72 71  Resp: 13 14  Temp:  36.9 C    Last Pain:  Vitals:   06/17/16 0941  TempSrc:   PainSc: 0-No pain                 Everett Ehrler EDWARD

## 2016-06-17 NOTE — Discharge Instructions (Addendum)
CYSTOSCOPY HOME CARE INSTRUCTIONS  Activity: Rest for the remainder of the day.  Do not drive or operate equipment today.  You may resume normal activities in one to two days as instructed by your physician.   Meals: Drink plenty of liquids and eat light foods such as gelatin or soup this evening.  You may return to a normal meal plan tomorrow.  Return to Work: You may return to work in one to two days or as instructed by your physician.  Special Instructions / Symptoms: Call your physician if any of these symptoms occur:   -persistent or heavy bleeding  -bleeding which continues after first few urination  -large blood clots that are difficult to pass  -urine stream diminishes or stops completely  -fever equal to or higher than 101 degrees Farenheit.  -cloudy urine with a strong, foul odor  -severe pain  Females should always wipe from front to back after elimination.  You may feel some burning pain when you urinate.  This should disappear with time.  Applying moist heat to the lower abdomen or a hot tub bath may help relieve the pain. \  You may remove the stent by pulling the attached string on Friday morning.    Bring the stone to the office for your f/u appointment.   Post Anesthesia Home Care Instructions  Activity: Get plenty of rest for the remainder of the day. A responsible adult should stay with you for 24 hours following the procedure.  For the next 24 hours, DO NOT: -Drive a car -Paediatric nurse -Drink alcoholic beverages -Take any medication unless instructed by your physician -Make any legal decisions or sign important papers.  Meals: Start with liquid foods such as gelatin or soup. Progress to regular foods as tolerated. Avoid greasy, spicy, heavy foods. If nausea and/or vomiting occur, drink only clear liquids until the nausea and/or vomiting subsides. Call your physician if vomiting continues.  Special Instructions/Symptoms: Your throat may feel dry or  sore from the anesthesia or the breathing tube placed in your throat during surgery. If this causes discomfort, gargle with warm salt water. The discomfort should disappear within 24 hours.  If you had a scopolamine patch placed behind your ear for the management of post- operative nausea and/or vomiting:  1. The medication in the patch is effective for 72 hours, after which it should be removed.  Wrap patch in a tissue and discard in the trash. Wash hands thoroughly with soap and water. 2. You may remove the patch earlier than 72 hours if you experience unpleasant side effects which may include dry mouth, dizziness or visual disturbances. 3. Avoid touching the patch. Wash your hands with soap and water after contact with the patch.   [facility] [phone] Post Ureteroscopy With or Without Stent Instructions  Definitions:  Ureter: The duct that transports urine from the kidney to the bladder. Stent:   A plastic hollow tube that is placed into the ureter, from the kidney to the                 bladder to prevent the ureter from swelling shut.  GENERAL INSTRUCTIONS:  Despite the fact that no skin incisions were used, the area around the ureter and bladder is raw and irritated. The stent is a foreign body which will further irritate the bladder wall. This irritation is manifested by increased frequency of urination, both day and night, and by an increase in the urge to urinate. In some, the  urge to urinate is present almost always. Sometimes the urge is strong enough that you may not be able to stop yourself from urinating. The only real cure is to remove the stent and then give time for the bladder wall to heal which can't be done until the danger of the ureter swelling shut has passed, which varies.  You may see some blood in your urine while the stent is in place and a few days afterwards. Do not be alarmed, even if the urine was clear for a while. Get off your feet and drink  lots of fluids until clearing occurs. If you start to pass clots or don't improve, call us.  DIET: You may return to your normal diet immediately. Because of the raw surface of your bladder, alcohol, spicy foods, acid type foods and drinks with caffeine may cause irritation or frequency and should be used in moderation. To keep your urine flowing freely and to avoid constipation, drink plenty of fluids during the day ( 8-10 glasses ). Tip: Avoid cranberry juice because it is very acidic.  ACTIVITY: Your physical activity doesn't need to be restricted. However, if you are very active, you may see some blood in your urine. We suggest that you reduce your activity under these circumstances until the bleeding has stopped.  BOWELS: It is important to keep your bowels regular during the postoperative period. Straining with bowel movements can cause bleeding. A bowel movement every other day is reasonable. Use a mild laxative if needed, such as Milk of Magnesia 2-3 tablespoons, or 2 Dulcolax tablets. Call if you continue to have problems. If you have been taking narcotics for pain, before, during or after your surgery, you may be constipated. Take a laxative if necessary.   MEDICATION: You should resume your pre-surgery medications unless told not to. In addition you will often be given an antibiotic to prevent infection. These should be taken as prescribed until the bottles are finished unless you are having an unusual reaction to one of the drugs.  PROBLEMS YOU SHOULD REPORT TO [place]:  Fevers over 100.5 Fahrenheit.  Heavy bleeding, or clots ( See above notes about blood in urine ).  Inability to urinate.  Drug reactions ( hives, rash, nausea, vomiting, diarrhea ).  Severe burning or pain with urination that is not improving.  FOLLOW-UP: You will need a follow-up appointment to monitor your progress. Call for this appointment at the number listed above. Usually the first appointment will be about  three to fourteen days after your surgery.

## 2016-06-17 NOTE — Brief Op Note (Signed)
06/17/2016  8:15 AM  PATIENT:  Howard Rogers  40 y.o. male  PRE-OPERATIVE DIAGNOSIS:  RIGHT MID URETERAL STONE  POST-OPERATIVE DIAGNOSIS:  RIGHT MID URETERAL STONE  PROCEDURE:  Procedure(s): CYSTOSCOPY WITH RETROGRADE PYELOGRAM, URETEROSCOPY AND STENT PLACEMENT (Right) HOLMIUM LASER APPLICATION (Right)  SURGEON:  Surgeon(s) and Role:    * Irine Seal, MD - Primary  PHYSICIAN ASSISTANT:   ASSISTANTS: none   ANESTHESIA:   general  EBL:  Total I/O In: 600 [I.V.:600] Out: 5 [Blood:5]  BLOOD ADMINISTERED:none  DRAINS: 6 x 26 right JJ stent   LOCAL MEDICATIONS USED:  LIDOCAINE  and Amount: 10 ml 2% Jelly  SPECIMEN:  Source of Specimen:  stone fragments  DISPOSITION OF SPECIMEN:  to patient to bring to the office.  COUNTS:  YES  TOURNIQUET:  * No tourniquets in log *  DICTATION: .Other Dictation: Dictation Number [id number]  PLAN OF CARE: Discharge to home after PACU  PATIENT DISPOSITION:  PACU - hemodynamically stable.   Delay start of Pharmacological VTE agent (>24hrs) due to surgical blood loss or risk of bleeding: not applicable

## 2016-06-17 NOTE — Anesthesia Procedure Notes (Signed)
Procedure Name: LMA Insertion Date/Time: 06/17/2016 7:35 AM Performed by: Wanita Chamberlain Pre-anesthesia Checklist: Patient identified, Timeout performed, Emergency Drugs available, Suction available and Patient being monitored Patient Re-evaluated:Patient Re-evaluated prior to inductionOxygen Delivery Method: Circle system utilized Preoxygenation: Pre-oxygenation with 100% oxygen Intubation Type: IV induction Ventilation: Mask ventilation without difficulty LMA: LMA inserted LMA Size: 5.0 Number of attempts: 1 Placement Confirmation: positive ETCO2 and breath sounds checked- equal and bilateral Tube secured with: Tape Dental Injury: Teeth and Oropharynx as per pre-operative assessment

## 2016-06-18 ENCOUNTER — Encounter (HOSPITAL_BASED_OUTPATIENT_CLINIC_OR_DEPARTMENT_OTHER): Payer: Self-pay | Admitting: Urology

## 2016-06-18 NOTE — Op Note (Signed)
Howard Rogers             ACCOUNT NO.:  [id number]  MEDICAL RECORD NO.:  [id number]  LOCATION:                                 FACILITY:  PHYSICIAN:  Marshall Cork. Jeffie Pollock, M.D.    DATE OF BIRTH:  12/22/1975  DATE OF PROCEDURE:  06/17/2016 DATE OF DISCHARGE:                              OPERATIVE REPORT   PROCEDURES: 1. Cystoscopy with right retrograde pyelogram and interpretation. 2. Right ureteroscopic stone extraction with holmium lasertripsy and     insertion of right double-J stent.  PREOPERATIVE DIAGNOSIS:  Right midureteral stone.  POSTOPERATIVE DIAGNOSIS:  Right midureteral stone.  SURGEON:  Marshall Cork. Jeffie Pollock, M.D.  ANESTHESIA:  General.  SPECIMEN:  Stone fragments.  DRAINS:  A 6-French x 26-cm Contour double-J stent on the right with tether.  BLOOD LOSS:  Minimal.  COMPLICATIONS:  None.  INDICATIONS:  Howard Rogers is a 40 year old white male who has a 7-mm midureteral stone that is not progressed for several months.  It was felt that the ureteroscopy was indicated for treatment.  FINDINGS AND PROCEDURE:  He was taken to the operating room where a general anesthetic was induced.  He was placed in lithotomy position. He was given Ancef.  He was fitted with PAS hose.  His perineum and genitalia were prepped with Betadine solution and was draped in usual sterile fashion.  Cystoscopy was performed using the 23-French scope and 30-degree lens. Examination revealed the normal urethra.  The external sphincter was intact.  The prostatic urethra was short without obstruction. Examination of the bladder revealed smooth wall without tumor, stones, or inflammation.  The ureteral orifices were unremarkable.  The right ureteral orifice was cannulated with 5-French open-ended catheter and contrast was instilled.  This demonstrated a somewhat narrowed distal ureter with a filling defect in the midureter consistent with a stone with some mild proximal dilation.  Once the  retrograde pyelogram was completed, a guidewire was passed to the kidney and the cystoscope was removed.  A 12-French introducer sheath inner core was then passed over the wire and the distal ureter was dilated; however, I could only pass it approximately 3 cm into the ureter.  At this point, the 4.5-French semirigid ureteroscope was then passed alongside the wire, I was able to advance this easily up to the level of the stone.  The stone was then engaged with a 365 micron holmium laser fiber set on 1 watt and 20 hertz.  The stone was broken into manageable fragments.  An N-Gage basket was then used to retrieve all sizable fragments.  Final inspection revealed some small grit, but no significant fragments.  The ureteroscope was removed and the cystoscope was then reinserted alongside the wire.  The bladder was evacuated free of stone fragments.  The cystoscope was then reinserted over the wire and a 6-French x 26-cm Contour double-J stent with tether was inserted over the wire to the kidney under fluoroscopic guidance.  The wire was removed leaving good coil in the kidney and good coil in the bladder.  The bladder was drained.  The cystoscope was removed leaving the stent string exiting urethra.  The urethra was instilled with 10 mL of 2% lidocaine jelly.  B and O suppository was placed.  The tether was secured to the patient's penis.  He was taken down from lithotomy position.  His anesthetic was reversed.  He was moved to the recovery room in stable condition.  There were no complications.     Marshall Cork. Jeffie Pollock, M.D.   ______________________________ Marshall Cork. Jeffie Pollock, M.D.    JJW/MEDQ  D:  06/17/2016  T:  06/18/2016  Job:  [id number]

## 2016-07-04 ENCOUNTER — Ambulatory Visit (INDEPENDENT_AMBULATORY_CARE_PROVIDER_SITE_OTHER): Payer: 59 | Admitting: Urology

## 2016-07-04 ENCOUNTER — Other Ambulatory Visit (HOSPITAL_COMMUNITY)
Admission: RE | Admit: 2016-07-04 | Discharge: 2016-07-04 | Disposition: A | Discharge status: Home / Self Care | Payer: 59 | Source: Ambulatory Visit | Attending: Urology | Admitting: Urology

## 2016-07-04 DIAGNOSIS — N201 Calculus of ureter: Secondary | ICD-10-CM | POA: Diagnosis present

## 2016-07-17 LAB — STONE ANALYSIS
CA PHOS CRY STONE QL IR: 5 %
Ca Oxalate,Monohydr.: 95 %
STONE WEIGHT KSTONE: 49 mg

## 2016-09-05 DIAGNOSIS — M545 Low back pain: Secondary | ICD-10-CM | POA: Diagnosis not present

## 2016-09-05 DIAGNOSIS — E1165 Type 2 diabetes mellitus with hyperglycemia: Secondary | ICD-10-CM | POA: Diagnosis not present

## 2016-09-05 DIAGNOSIS — I10 Essential (primary) hypertension: Secondary | ICD-10-CM | POA: Diagnosis not present

## 2016-09-30 DIAGNOSIS — G4733 Obstructive sleep apnea (adult) (pediatric): Secondary | ICD-10-CM | POA: Diagnosis not present

## 2016-12-12 DIAGNOSIS — I10 Essential (primary) hypertension: Secondary | ICD-10-CM | POA: Diagnosis not present

## 2016-12-12 DIAGNOSIS — M545 Low back pain: Secondary | ICD-10-CM | POA: Diagnosis not present

## 2016-12-12 DIAGNOSIS — E119 Type 2 diabetes mellitus without complications: Secondary | ICD-10-CM | POA: Diagnosis not present

## 2016-12-12 DIAGNOSIS — E1165 Type 2 diabetes mellitus with hyperglycemia: Secondary | ICD-10-CM | POA: Diagnosis not present

## 2017-01-09 ENCOUNTER — Ambulatory Visit: Payer: 59 | Admitting: Urology

## 2017-01-30 DIAGNOSIS — G4733 Obstructive sleep apnea (adult) (pediatric): Secondary | ICD-10-CM | POA: Diagnosis not present

## 2017-02-24 IMAGING — CT CT RENAL STONE PROTOCOL
2 of 5 series · 16 of 46 positions shown, 18 images · non-contrast
Comparison: Ultrasound exam 04/11/2016.  CT scan 01/24/2016.

CLINICAL DATA: Microscopic hematuria with low back pain for 2
weeks. Right-sided caliectasis on recent ultrasound.

EXAM:
CT ABDOMEN AND PELVIS WITHOUT CONTRAST
TECHNIQUE: Multidetector CT imaging of the abdomen and pelvis was performed
following the standard protocol without IV contrast.

[Series 2: routine abd pel with · axial · 0.98mm/px · z∈[-508,-33]mm · 13 of 106 slices shown, 15 images]
[im 6/106  soft-tissue]
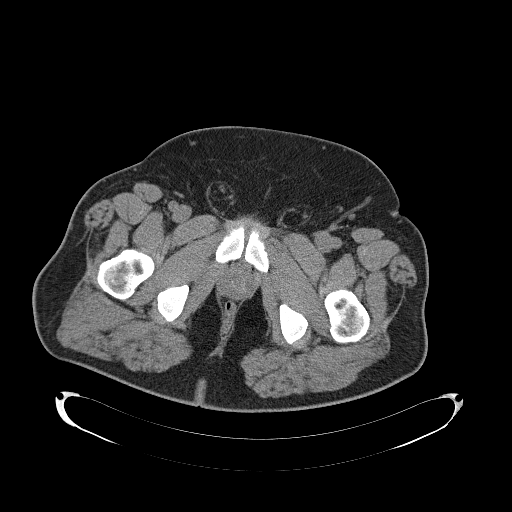
[im 6/106  bone]
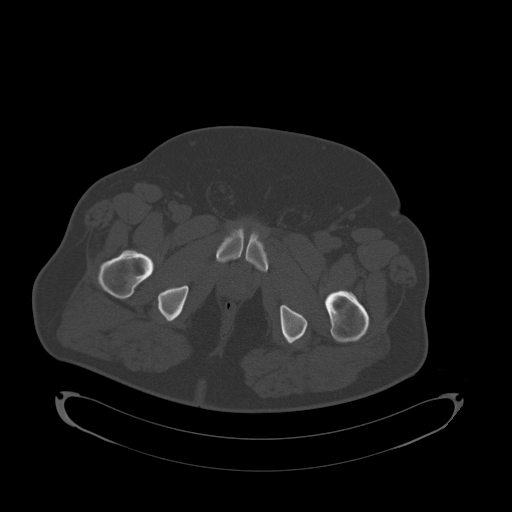
[im 16/106  soft-tissue]
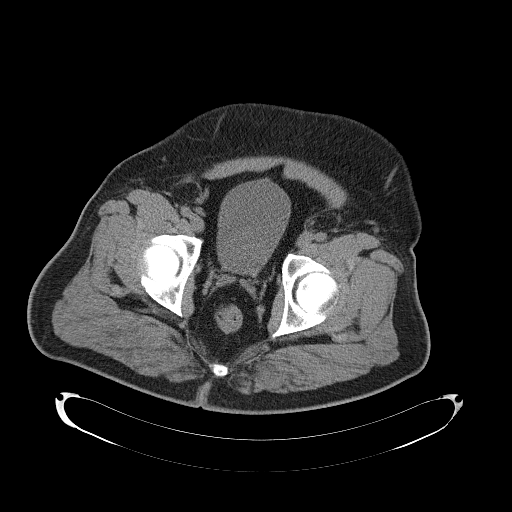
[im 21/106  soft-tissue]
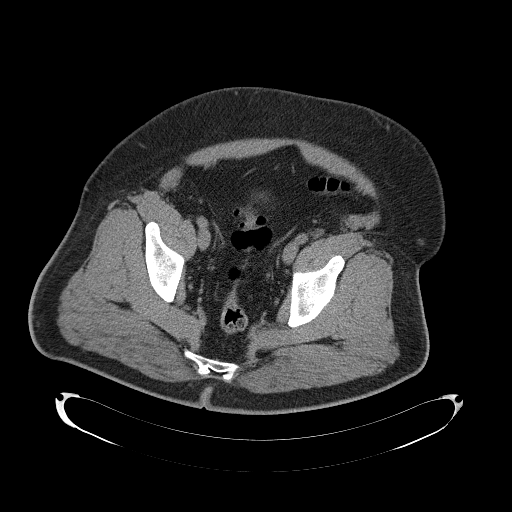
[im 31/106  soft-tissue]
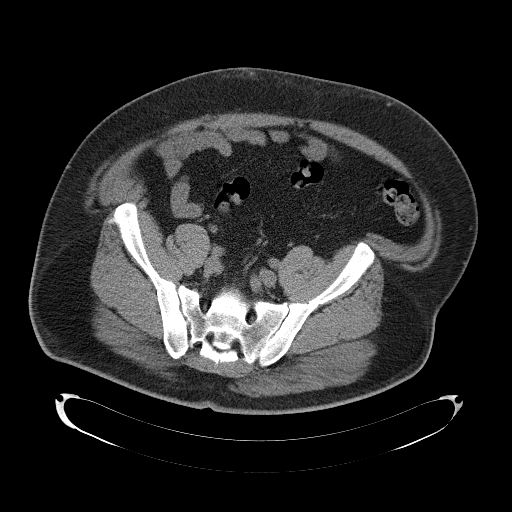
[im 36/106  soft-tissue]
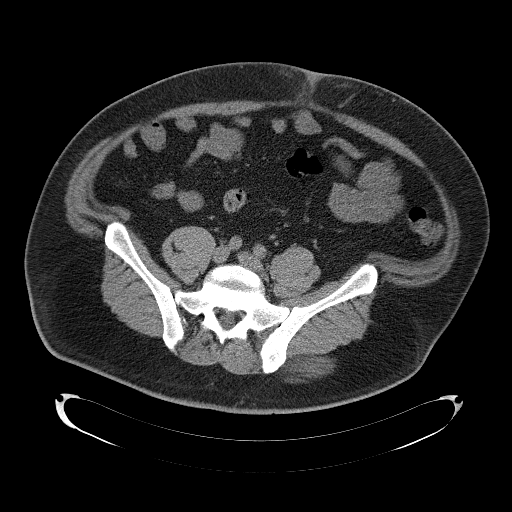
[im 46/106  soft-tissue]
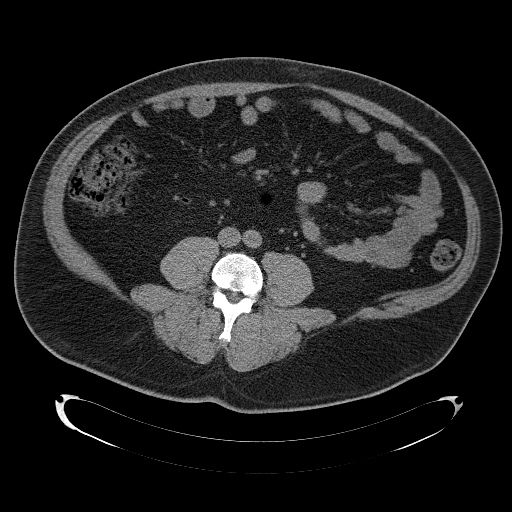
[im 56/106  soft-tissue]
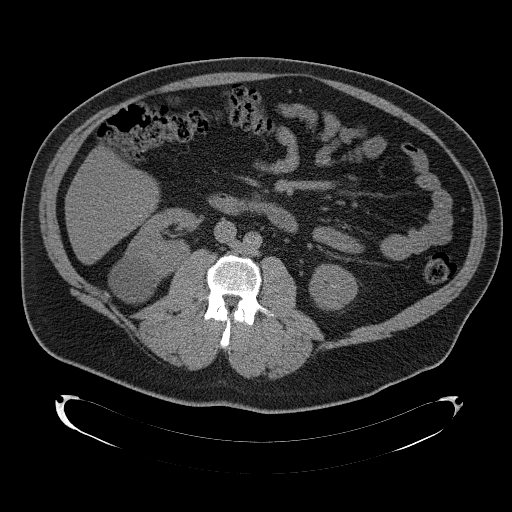
[im 61/106  soft-tissue]
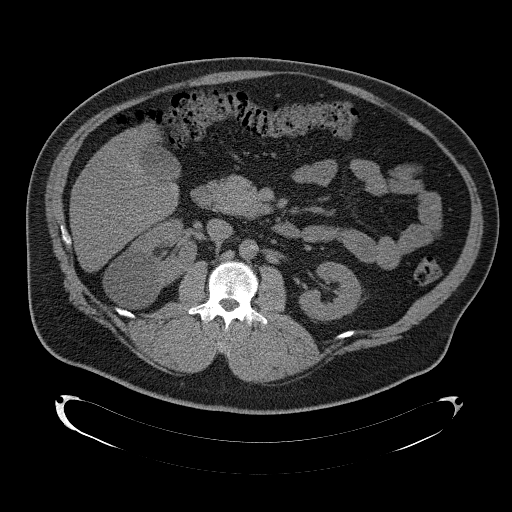
[im 71/106  soft-tissue]
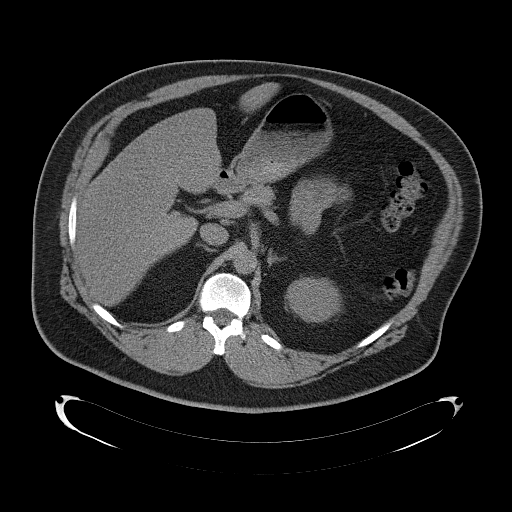
[im 71/106  bone]
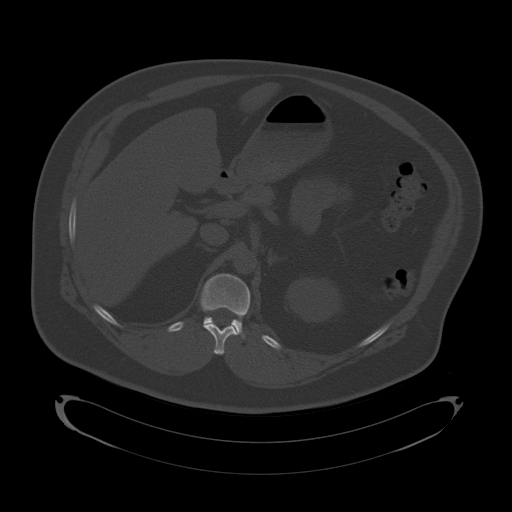
[im 76/106  soft-tissue]
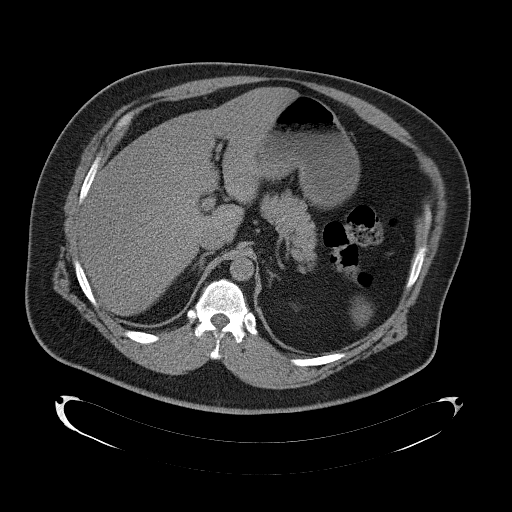
[im 86/106  soft-tissue]
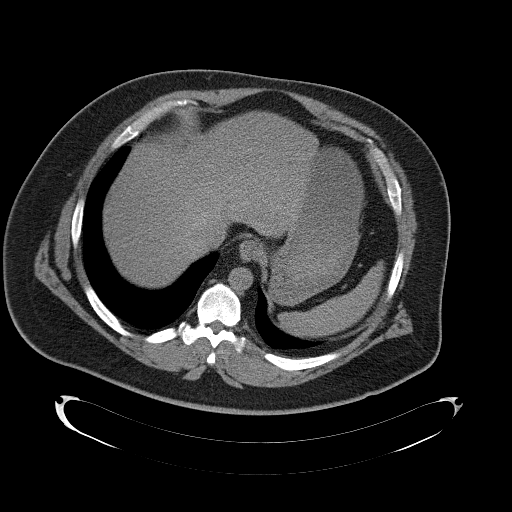
[im 91/106  soft-tissue]
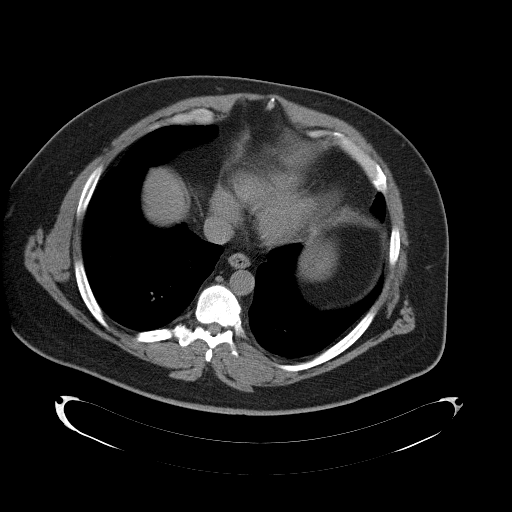
[im 101/106  soft-tissue]
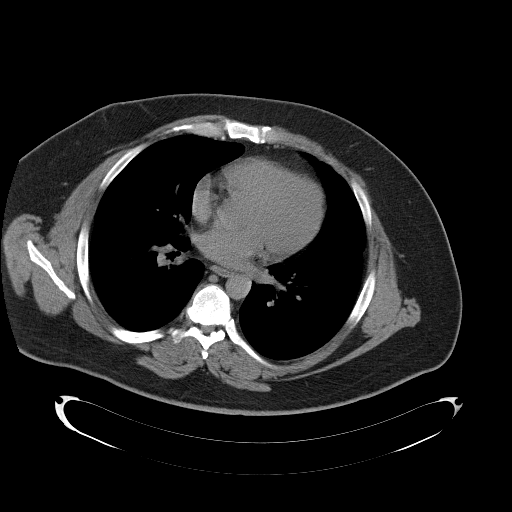

[Series 4: coronal · coronal · 1.02mm/px · 3 of 175 slices shown]
[im 59/175  soft-tissue]
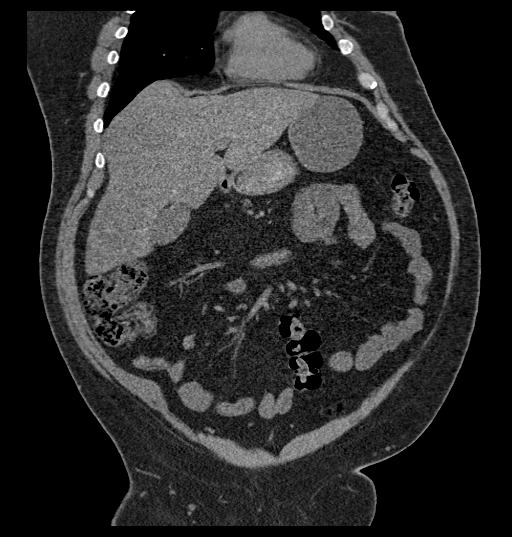
[im 78/175  soft-tissue]
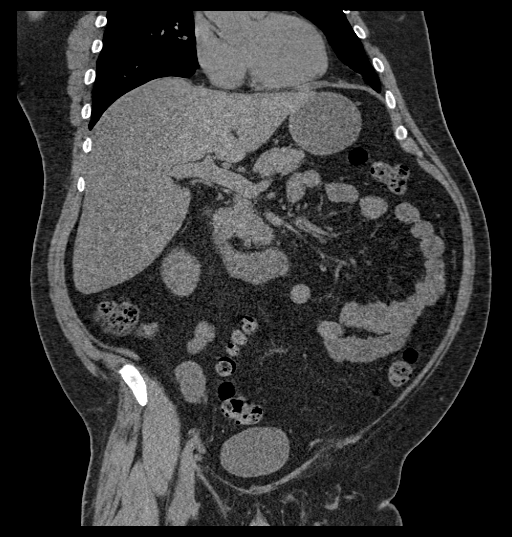
[im 97/175  soft-tissue]
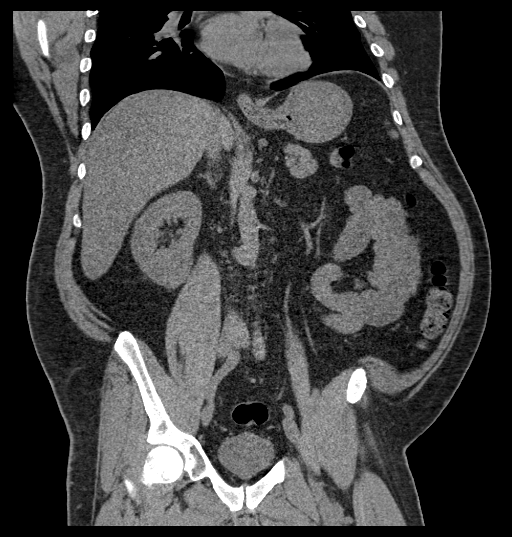

[16 of 46 positions shown; findings below may reference images not displayed]

FINDINGS: Lower chest:  Unremarkable.

Hepatobiliary: The liver shows diffusely decreased attenuation
suggesting steatosis. There is no evidence for gallstones,
gallbladder wall thickening, or pericholecystic fluid. No
intrahepatic or extrahepatic biliary dilation.

Pancreas: No focal mass lesion. No dilatation of the main duct. No
intraparenchymal cyst. No peripancreatic edema.

Spleen: No splenomegaly. No focal mass lesion.

Adrenals/Urinary Tract: No adrenal nodule or mass.

5.8 cm interpolar right renal cyst not substantially changed since
previous CT scan. No evidence for hydronephrosis in the right
kidney. The interpolar right renal stone seen on 01/24/2016 is no
longer evident. Despite the lack of right hydroureteronephrosis, the
patient has a 5 x 6 x 8 mm stone in the distal right ureter, just
proximal to the iliac vessels. This is probably the interpolar stone
that was seen on the previous CT scan. No other right ureteral
stone.

The left kidney is unremarkable without stone or hydronephrosis. No
left hydroureteronephrosis. No left renal stone.

No bladder stone.

Stomach/Bowel: Stomach is nondistended. No gastric wall thickening.
No evidence of outlet obstruction. Duodenum is normally positioned
as is the ligament of Treitz. No small bowel wall thickening. No
small bowel dilatation. The terminal ileum is normal. The appendix
is normal. No gross colonic mass. No colonic wall thickening. No
substantial diverticular change.

Vascular/Lymphatic: No abdominal aortic aneurysm. No abdominal
aortic atherosclerotic calcification. There is no gastrohepatic or
hepatoduodenal ligament lymphadenopathy. No intraperitoneal or
retroperitoneal lymphadenopathy. No pelvic sidewall lymphadenopathy.

Reproductive: The prostate gland and seminal vesicles have normal
imaging features.

Other: No intraperitoneal free fluid.

Musculoskeletal: Bilateral inguinal hernias contain only fat. Bone
windows reveal no worrisome lytic or sclerotic osseous lesions.
Large posterior disc osteophyte complex seen at L4-5 and L5-S1.
IMPRESSION: 1. Interval migration of the interpolar right renal stone in the
distal right ureter. Despite the presence of the distal right
ureteral stone, there is no right hydroureteronephrosis and no
appreciable secondary changes around the right kidney or ureter.
2. Stable large right renal cyst.
3. Hepatic steatosis.

## 2017-03-13 DIAGNOSIS — E784 Other hyperlipidemia: Secondary | ICD-10-CM | POA: Diagnosis not present

## 2017-03-13 DIAGNOSIS — E119 Type 2 diabetes mellitus without complications: Secondary | ICD-10-CM | POA: Diagnosis not present

## 2017-03-13 DIAGNOSIS — I10 Essential (primary) hypertension: Secondary | ICD-10-CM | POA: Diagnosis not present

## 2017-06-17 DIAGNOSIS — E7849 Other hyperlipidemia: Secondary | ICD-10-CM | POA: Diagnosis not present

## 2017-06-17 DIAGNOSIS — E119 Type 2 diabetes mellitus without complications: Secondary | ICD-10-CM | POA: Diagnosis not present

## 2017-06-17 DIAGNOSIS — M545 Low back pain: Secondary | ICD-10-CM | POA: Diagnosis not present

## 2017-06-17 DIAGNOSIS — I10 Essential (primary) hypertension: Secondary | ICD-10-CM | POA: Diagnosis not present

## 2017-09-24 DIAGNOSIS — I10 Essential (primary) hypertension: Secondary | ICD-10-CM | POA: Diagnosis not present

## 2017-09-24 DIAGNOSIS — E119 Type 2 diabetes mellitus without complications: Secondary | ICD-10-CM | POA: Diagnosis not present

## 2017-09-24 DIAGNOSIS — E7849 Other hyperlipidemia: Secondary | ICD-10-CM | POA: Diagnosis not present

## 2017-12-22 DIAGNOSIS — E7849 Other hyperlipidemia: Secondary | ICD-10-CM | POA: Diagnosis not present

## 2017-12-22 DIAGNOSIS — I10 Essential (primary) hypertension: Secondary | ICD-10-CM | POA: Diagnosis not present

## 2017-12-22 DIAGNOSIS — E119 Type 2 diabetes mellitus without complications: Secondary | ICD-10-CM | POA: Diagnosis not present

## 2017-12-24 DIAGNOSIS — M545 Low back pain: Secondary | ICD-10-CM | POA: Diagnosis not present

## 2017-12-24 DIAGNOSIS — E7849 Other hyperlipidemia: Secondary | ICD-10-CM | POA: Diagnosis not present

## 2017-12-24 DIAGNOSIS — I10 Essential (primary) hypertension: Secondary | ICD-10-CM | POA: Diagnosis not present

## 2017-12-24 DIAGNOSIS — E119 Type 2 diabetes mellitus without complications: Secondary | ICD-10-CM | POA: Diagnosis not present

## 2018-01-11 DIAGNOSIS — W268XXA Contact with other sharp object(s), not elsewhere classified, initial encounter: Secondary | ICD-10-CM | POA: Diagnosis not present

## 2018-01-11 DIAGNOSIS — S61512A Laceration without foreign body of left wrist, initial encounter: Secondary | ICD-10-CM | POA: Diagnosis not present

## 2018-01-11 DIAGNOSIS — I10 Essential (primary) hypertension: Secondary | ICD-10-CM | POA: Diagnosis not present

## 2018-01-11 DIAGNOSIS — E119 Type 2 diabetes mellitus without complications: Secondary | ICD-10-CM | POA: Diagnosis not present

## 2018-03-24 DIAGNOSIS — E119 Type 2 diabetes mellitus without complications: Secondary | ICD-10-CM | POA: Diagnosis not present

## 2018-03-24 DIAGNOSIS — I10 Essential (primary) hypertension: Secondary | ICD-10-CM | POA: Diagnosis not present

## 2018-03-24 DIAGNOSIS — E7849 Other hyperlipidemia: Secondary | ICD-10-CM | POA: Diagnosis not present

## 2018-03-24 DIAGNOSIS — Z0000 Encounter for general adult medical examination without abnormal findings: Secondary | ICD-10-CM | POA: Diagnosis not present

## 2018-06-16 DIAGNOSIS — E7849 Other hyperlipidemia: Secondary | ICD-10-CM | POA: Diagnosis not present

## 2018-06-16 DIAGNOSIS — E119 Type 2 diabetes mellitus without complications: Secondary | ICD-10-CM | POA: Diagnosis not present

## 2018-06-16 DIAGNOSIS — I10 Essential (primary) hypertension: Secondary | ICD-10-CM | POA: Diagnosis not present

## 2018-06-16 DIAGNOSIS — Z6837 Body mass index (BMI) 37.0-37.9, adult: Secondary | ICD-10-CM | POA: Diagnosis not present

## 2018-06-16 DIAGNOSIS — N182 Chronic kidney disease, stage 2 (mild): Secondary | ICD-10-CM | POA: Diagnosis not present

## 2020-07-31 ENCOUNTER — Encounter (INDEPENDENT_AMBULATORY_CARE_PROVIDER_SITE_OTHER): Payer: Self-pay | Admitting: *Deleted

## 2020-11-01 ENCOUNTER — Encounter (INDEPENDENT_AMBULATORY_CARE_PROVIDER_SITE_OTHER): Payer: Self-pay

## 2020-11-01 ENCOUNTER — Telehealth (INDEPENDENT_AMBULATORY_CARE_PROVIDER_SITE_OTHER): Payer: Self-pay

## 2020-11-01 ENCOUNTER — Other Ambulatory Visit (INDEPENDENT_AMBULATORY_CARE_PROVIDER_SITE_OTHER): Payer: Self-pay

## 2020-11-01 DIAGNOSIS — Z1211 Encounter for screening for malignant neoplasm of colon: Secondary | ICD-10-CM

## 2020-11-01 MED ORDER — PEG 3350-KCL-NA BICARB-NACL 420 G PO SOLR: 4000.0000 mL | ORAL | 0 refills | Status: DC

## 2020-11-01 NOTE — Telephone Encounter (Signed)
LeighAnn Lovelace, CMA  

## 2020-11-01 NOTE — Telephone Encounter (Signed)
Referring MD/PCP: Hasanaj  Procedure: Tcs  Reason/Indication:  Screening  Has patient had this procedure before?  no  If so, when, by whom and where?    Is there a family history of colon cancer?  no  Who?  What age when diagnosed?    Is patient diabetic?   yes      Does patient have prosthetic heart valve or mechanical valve?  no3  Do you have a pacemaker/defibrillator?  no  Has patient ever had endocarditis/atrial fibrillation? no  Have you had a stroke/heart attack last 6 mths? no  Does patient use oxygen? no  Has patient had joint replacement within last 12 months?  no  Is patient constipated or do they take laxatives? no  Does patient have a history of alcohol/drug use?  no  Is patient on blood thinner such as Coumadin, Plavix and/or Aspirin? no  Do you take medicine for weight loss. no  Medications: Xultophy 20 mg daily, Humalog 15 mg tid, Jardiance 10 mg daily, metformin 1000 mg bid, simvastatin 20 mg daily, amlodipine 20 mg daily, meloxicam 15 mg daily, sodium bicarb 650 mg bid  Allergies: codeine  Procedure date & time: 11/13/20 AM

## 2020-11-01 NOTE — Telephone Encounter (Signed)
Ok to schedule.  Thanks,  Daniel Castaneda Mayorga, MD Gastroenterology and Hepatology [facility]  

## 2020-11-02 NOTE — Telephone Encounter (Signed)
Howard Rogers, CMA  

## 2020-11-12 ENCOUNTER — Other Ambulatory Visit (HOSPITAL_COMMUNITY)
Admission: RE | Admit: 2020-11-12 | Discharge: 2020-11-12 | Disposition: A | Discharge status: Home / Self Care | Payer: BC Managed Care – PPO | Source: Ambulatory Visit | Attending: Gastroenterology | Admitting: Gastroenterology

## 2020-11-12 ENCOUNTER — Other Ambulatory Visit: Payer: Self-pay

## 2020-11-12 DIAGNOSIS — Z01812 Encounter for preprocedural laboratory examination: Secondary | ICD-10-CM | POA: Insufficient documentation

## 2020-11-12 DIAGNOSIS — I10 Essential (primary) hypertension: Secondary | ICD-10-CM | POA: Diagnosis not present

## 2020-11-12 DIAGNOSIS — K635 Polyp of colon: Secondary | ICD-10-CM | POA: Diagnosis not present

## 2020-11-12 DIAGNOSIS — Z20822 Contact with and (suspected) exposure to covid-19: Secondary | ICD-10-CM | POA: Insufficient documentation

## 2020-11-12 DIAGNOSIS — E119 Type 2 diabetes mellitus without complications: Secondary | ICD-10-CM | POA: Diagnosis not present

## 2020-11-12 DIAGNOSIS — Z1211 Encounter for screening for malignant neoplasm of colon: Secondary | ICD-10-CM | POA: Diagnosis present

## 2020-11-12 DIAGNOSIS — Z791 Long term (current) use of non-steroidal anti-inflammatories (NSAID): Secondary | ICD-10-CM | POA: Diagnosis not present

## 2020-11-12 DIAGNOSIS — Z7984 Long term (current) use of oral hypoglycemic drugs: Secondary | ICD-10-CM | POA: Diagnosis not present

## 2020-11-12 DIAGNOSIS — K648 Other hemorrhoids: Secondary | ICD-10-CM | POA: Diagnosis not present

## 2020-11-12 DIAGNOSIS — Z794 Long term (current) use of insulin: Secondary | ICD-10-CM | POA: Diagnosis not present

## 2020-11-12 DIAGNOSIS — E785 Hyperlipidemia, unspecified: Secondary | ICD-10-CM | POA: Diagnosis not present

## 2020-11-12 DIAGNOSIS — Z79899 Other long term (current) drug therapy: Secondary | ICD-10-CM | POA: Diagnosis not present

## 2020-11-12 LAB — SARS CORONAVIRUS 2 (TAT 6-24 HRS): SARS Coronavirus 2: NEGATIVE

## 2020-11-13 ENCOUNTER — Encounter (INDEPENDENT_AMBULATORY_CARE_PROVIDER_SITE_OTHER): Payer: Self-pay | Admitting: *Deleted

## 2020-11-13 ENCOUNTER — Ambulatory Visit (HOSPITAL_COMMUNITY)
Admission: RE | Admit: 2020-11-13 | Discharge: 2020-11-13 | Disposition: A | Discharge status: Home / Self Care | Payer: BC Managed Care – PPO | Attending: Gastroenterology | Admitting: Gastroenterology

## 2020-11-13 ENCOUNTER — Encounter (HOSPITAL_COMMUNITY)
Admission: RE | Disposition: A | Discharge status: Home / Self Care | Payer: Self-pay | Source: Home / Self Care | Attending: Gastroenterology

## 2020-11-13 ENCOUNTER — Ambulatory Visit (HOSPITAL_COMMUNITY): Payer: BC Managed Care – PPO | Admitting: Anesthesiology

## 2020-11-13 ENCOUNTER — Other Ambulatory Visit: Payer: Self-pay

## 2020-11-13 ENCOUNTER — Encounter (HOSPITAL_COMMUNITY): Payer: Self-pay | Admitting: Gastroenterology

## 2020-11-13 DIAGNOSIS — E119 Type 2 diabetes mellitus without complications: Secondary | ICD-10-CM | POA: Insufficient documentation

## 2020-11-13 DIAGNOSIS — Z7984 Long term (current) use of oral hypoglycemic drugs: Secondary | ICD-10-CM | POA: Insufficient documentation

## 2020-11-13 DIAGNOSIS — Z1211 Encounter for screening for malignant neoplasm of colon: Secondary | ICD-10-CM | POA: Diagnosis not present

## 2020-11-13 DIAGNOSIS — K648 Other hemorrhoids: Secondary | ICD-10-CM | POA: Insufficient documentation

## 2020-11-13 DIAGNOSIS — Z20822 Contact with and (suspected) exposure to covid-19: Secondary | ICD-10-CM | POA: Insufficient documentation

## 2020-11-13 DIAGNOSIS — I10 Essential (primary) hypertension: Secondary | ICD-10-CM | POA: Insufficient documentation

## 2020-11-13 DIAGNOSIS — K635 Polyp of colon: Secondary | ICD-10-CM | POA: Insufficient documentation

## 2020-11-13 DIAGNOSIS — D125 Benign neoplasm of sigmoid colon: Secondary | ICD-10-CM | POA: Diagnosis not present

## 2020-11-13 DIAGNOSIS — Z791 Long term (current) use of non-steroidal anti-inflammatories (NSAID): Secondary | ICD-10-CM | POA: Insufficient documentation

## 2020-11-13 DIAGNOSIS — Z794 Long term (current) use of insulin: Secondary | ICD-10-CM | POA: Insufficient documentation

## 2020-11-13 DIAGNOSIS — Z79899 Other long term (current) drug therapy: Secondary | ICD-10-CM | POA: Insufficient documentation

## 2020-11-13 DIAGNOSIS — E785 Hyperlipidemia, unspecified: Secondary | ICD-10-CM | POA: Insufficient documentation

## 2020-11-13 HISTORY — PX: COLONOSCOPY WITH PROPOFOL: SHX5780

## 2020-11-13 HISTORY — PX: POLYPECTOMY: SHX5525

## 2020-11-13 LAB — HM COLONOSCOPY

## 2020-11-13 LAB — GLUCOSE, CAPILLARY: Glucose-Capillary: 137 mg/dL — ABNORMAL HIGH (ref 70–99)

## 2020-11-13 SURGERY — COLONOSCOPY WITH PROPOFOL
Anesthesia: General

## 2020-11-13 MED ORDER — PROPOFOL 500 MG/50ML IV EMUL
INTRAVENOUS | Status: DC | PRN
  Administered 2020-11-13: 100 ug/kg/min via INTRAVENOUS

## 2020-11-13 MED ORDER — CHLORHEXIDINE GLUCONATE CLOTH 2 % EX PADS: 6.0000 | MEDICATED_PAD | Freq: Once | CUTANEOUS | Status: DC

## 2020-11-13 MED ORDER — LACTATED RINGERS IV SOLN: INTRAVENOUS | Status: DC

## 2020-11-13 MED ORDER — LIDOCAINE HCL (CARDIAC) PF 100 MG/5ML IV SOSY
PREFILLED_SYRINGE | INTRAVENOUS | Status: DC | PRN
  Administered 2020-11-13: 30 mg via INTRAVENOUS

## 2020-11-13 MED ORDER — STERILE WATER FOR IRRIGATION IR SOLN
Status: DC | PRN
  Administered 2020-11-13: 100 mL

## 2020-11-13 MED ORDER — PROPOFOL 10 MG/ML IV BOLUS
INTRAVENOUS | Status: DC | PRN
  Administered 2020-11-13 (×2): 50 mg via INTRAVENOUS
  Administered 2020-11-13: 40 mg via INTRAVENOUS
  Administered 2020-11-13: 100 mg via INTRAVENOUS

## 2020-11-13 NOTE — Anesthesia Postprocedure Evaluation (Signed)
Anesthesia Post Note  Patient: Laura Radilla  Procedure(s) Performed: COLONOSCOPY WITH PROPOFOL (N/A ) POLYPECTOMY  Patient location during evaluation: Phase II Anesthesia Type: General Level of consciousness: awake and alert and oriented Pain management: pain level controlled Vital Signs Assessment: post-procedure vital signs reviewed and stable Respiratory status: spontaneous breathing, nonlabored ventilation and respiratory function stable Cardiovascular status: blood pressure returned to baseline and stable Postop Assessment: no apparent nausea or vomiting Anesthetic complications: no   No complications documented.   Last Vitals:  Vitals:   11/13/20 0902 11/13/20 1215  BP: 133/78 108/71  Pulse:  70  Resp: 12 16  Temp: 36.7 C 36.6 C  SpO2: 97% 95%    Last Pain:  Vitals:   11/13/20 1215  TempSrc: Oral  PainSc: 0-No pain                 Orlie Dakin

## 2020-11-13 NOTE — Anesthesia Procedure Notes (Signed)
Date/Time: 11/13/2020 11:59 AM Performed by: Orlie Dakin, CRNA Pre-anesthesia Checklist: Patient identified, Emergency Drugs available, Suction available and Patient being monitored Patient Re-evaluated:Patient Re-evaluated prior to induction Oxygen Delivery Method: Non-rebreather mask Induction Type: IV induction Placement Confirmation: positive ETCO2

## 2020-11-13 NOTE — Anesthesia Preprocedure Evaluation (Signed)
Anesthesia Evaluation  Patient identified by MRN, date of birth, ID band Patient awake    Reviewed: Allergy & Precautions, NPO status , Patient's Chart, lab work & pertinent test results  History of Anesthesia Complications Negative for: history of anesthetic complications  Airway Mallampati: III  TM Distance: >3 FB Neck ROM: Full    Dental  (+) Dental Advisory Given, Teeth Intact   Pulmonary sleep apnea and Continuous Positive Airway Pressure Ventilation ,    Pulmonary exam normal breath sounds clear to auscultation       Cardiovascular Exercise Tolerance: Good hypertension, Pt. on medications Normal cardiovascular exam Rhythm:Regular Rate:Normal     Neuro/Psych negative neurological ROS  negative psych ROS   GI/Hepatic negative GI ROS, Neg liver ROS,   Endo/Other  diabetes, Well Controlled, Type 2, Oral Hypoglycemic Agents, Insulin Dependent  Renal/GU      Musculoskeletal  (+) Arthritis ,   Abdominal   Peds  Hematology   Anesthesia Other Findings   Reproductive/Obstetrics                             Anesthesia Physical Anesthesia Plan  ASA: III  Anesthesia Plan: General   Post-op Pain Management:    Induction: Intravenous  PONV Risk Score and Plan: Propofol infusion  Airway Management Planned: Nasal Cannula and Natural Airway  Additional Equipment:   Intra-op Plan:   Post-operative Plan:   Informed Consent: I have reviewed the patients History and Physical, chart, labs and discussed the procedure including the risks, benefits and alternatives for the proposed anesthesia with the patient or authorized representative who has indicated his/her understanding and acceptance.       Plan Discussed with: CRNA and Surgeon  Anesthesia Plan Comments:         Anesthesia Quick Evaluation

## 2020-11-13 NOTE — Transfer of Care (Signed)
Immediate Anesthesia Transfer of Care Note  Patient: Howard Rogers  Procedure(s) Performed: COLONOSCOPY WITH PROPOFOL (N/A ) POLYPECTOMY  Patient Location: PACU and Endoscopy Unit  Anesthesia Type:General  Level of Consciousness: awake, alert  and oriented  Airway & Oxygen Therapy: Patient Spontanous Breathing  Post-op Assessment: Report given to RN, Post -op Vital signs reviewed and stable and Patient moving all extremities X 4  Post vital signs: Reviewed and stable  Last Vitals:  Vitals Value Taken Time  BP 108/71 11/13/20 1215  Temp 36.6 C 11/13/20 1215  Pulse 70 11/13/20 1215  Resp 16 11/13/20 1215  SpO2 95 % 11/13/20 1215    Last Pain:  Vitals:   11/13/20 1215  TempSrc: Oral  PainSc: 0-No pain      Patients Stated Pain Goal: 8 (32/35/57 3220)  Complications: No complications documented.

## 2020-11-13 NOTE — H&P (Signed)
Chrishaun Sasso is an 45 y.o. male.   Chief Complaint: screening colonoscopy HPI: 45 y/o M with PMH DM, HTN, and HLD, coming for screening colonoscopy. The patient has never had a colonoscopy in the past.  The patient denies having any complaints such as melena, hematochezia, abdominal pain or distention, change in her bowel movement consistency or frequency, no changes in her weight recently.  No family history of colorectal cancer.   Past Medical History:  Diagnosis Date  . Arthritis   . History of kidney stones   . Hyperlipidemia   . Hypertension   . OSA on CPAP   . Right ureteral stone   . Type 2 diabetes mellitus ([place])   . Wears glasses     Past Surgical History:  Procedure Laterality Date  . CYSTO/ LEFT URETERAL STENT PLACEMENT  03-28-2013  [facility]  . CYSTOSCOPY WITH RETROGRADE PYELOGRAM, URETEROSCOPY AND STENT PLACEMENT Right 06/17/2016   Procedure: CYSTOSCOPY WITH RETROGRADE PYELOGRAM, URETEROSCOPY AND STENT PLACEMENT;  Surgeon: Irine Seal, MD;  Location: [facility];  Service: Urology;  Laterality: Right;  . HOLMIUM LASER APPLICATION Left 31/51/7616   Procedure:  LEFT STONE HOLMIUM LASER APPLICATION;  Surgeon: Irine Seal, MD;  Location: [facility];  Service: Urology;  Laterality: Left;  . HOLMIUM LASER APPLICATION Right 07/37/1062   Procedure: HOLMIUM LASER APPLICATION;  Surgeon: Irine Seal, MD;  Location: [facility];  Service: Urology;  Laterality: Right;  . 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 URETEROSCOPY Left 06/16/2013   Procedure: URETEROSCOPY, LEFT URETERAL STENT REMOVAL, LEFT URETERAL STONE EXTRACTION WITH NITINOL BASKET;  Surgeon: Irine Seal, MD;  Location: [facility];  Service: Urology;  Laterality: Left;    History reviewed. No pertinent family history. Social History:  reports that he has never smoked. He has never used smokeless tobacco. He reports that he does not drink  alcohol and does not use drugs.  Allergies:  Allergies  Allergen Reactions  . Codeine Itching  . Nickel Rash    Medications Prior to Admission  Medication Sig Dispense Refill  . amLODipine-benazepril (LOTREL) 10-20 MG capsule Take 1 capsule by mouth daily.    . empagliflozin (JARDIANCE) 10 MG TABS tablet Take 10 mg by mouth daily.    . Insulin Degludec-Liraglutide (XULTOPHY) 100-3.6 UNIT-MG/ML SOPN Inject 20 Units into the skin every morning.    . insulin lispro (HUMALOG) 100 UNIT/ML injection Inject 15 Units into the skin 3 (three) times daily before meals.    . meloxicam (MOBIC) 15 MG tablet Take 15 mg by mouth daily.    . metFORMIN (GLUCOPHAGE) 1000 MG tablet Take 1,000 mg by mouth 2 (two) times daily with a meal.    . Multiple Vitamins-Minerals (MENS MULTIVITAMIN PLUS PO) Take 1 tablet by mouth daily.    . Omega-3 Fatty Acids (FISH OIL) 1000 MG CAPS Take 1,000 mg by mouth daily.    . simvastatin (ZOCOR) 20 MG tablet Take 20 mg by mouth every evening.    . sodium bicarbonate 650 MG tablet Take 650 mg by mouth daily.    . Turmeric 500 MG CAPS Take 500 mg by mouth daily.    . polyethylene glycol-electrolytes (TRILYTE) 420 g solution Take 4,000 mLs by mouth as directed. 4000 mL 0    Results for orders placed or performed during the hospital encounter of 11/13/20 (from the past 48 hour(s))  Glucose, capillary     Status: Abnormal   Collection  Time: 11/13/20  9:05 AM  Result Value Ref Range   Glucose-Capillary 137 (H) 70 - 99 mg/dL    Comment: Glucose reference range applies only to samples taken after fasting for at least 8 hours.   No results found.  Review of Systems  Constitutional: Negative.   HENT: Negative.   Eyes: Negative.   Respiratory: Negative.   Cardiovascular: Negative.   Gastrointestinal: Negative.   Endocrine: Negative.   Genitourinary: Negative.   Musculoskeletal: Negative.   Skin: Negative.   Allergic/Immunologic: Negative.   Neurological: Negative.    Hematological: Negative.   Psychiatric/Behavioral: Negative.     Blood pressure 133/78, temperature 98 F (36.7 C), temperature source Oral, resp. rate 12, height 5\' 11"  (1.803 m), weight 124.7 kg, SpO2 97 %. Physical Exam  GENERAL: The patient is AO x3, in no acute distress. HEENT: Head is normocephalic and atraumatic. EOMI are intact. Mouth is well hydrated and without lesions. NECK: Supple. No masses LUNGS: Clear to auscultation. No presence of rhonchi/wheezing/rales. Adequate chest expansion HEART: RRR, normal s1 and s2. ABDOMEN: Soft, nontender, no guarding, no peritoneal signs, and nondistended. BS +. No masses. EXTREMITIES: Without any cyanosis, clubbing, rash, lesions or edema. NEUROLOGIC: AOx3, no focal motor deficit. SKIN: no jaundice, no rashes   Assessment/Plan  45 y/o M with PMH DM, HTN, and HLD, coming for screening colonoscopy. The patient has never had a colonoscopy in the past.  The patient is at average risk for colorectal cancer.  We will proceed with colonoscopy today.  Harvel Quale, MD 11/13/2020, 10:11 AM

## 2020-11-13 NOTE — Discharge Instructions (Signed)
Colonoscopy, Adult, Care After This sheet gives you information about how to care for yourself after your procedure. Your doctor may also give you more specific instructions. If you have problems or questions, call your doctor. What can I expect after the procedure? After the procedure, it is common to have:  A small amount of blood in your poop (stool) for 24 hours.  Some gas.  Mild cramping or bloating in your belly (abdomen). Follow these instructions at home: Eating and drinking  Drink enough fluid to keep your pee (urine) pale yellow.  Follow instructions from your doctor about what you cannot eat or drink.  Return to your normal diet as told by your doctor. Avoid heavy or fried foods that are hard to digest.   Activity  Rest as told by your doctor.  Do not sit for a long time without moving. Get up to take short walks every 1-2 hours. This is important. Ask for help if you feel weak or unsteady.  Return to your normal activities as told by your doctor. Ask your doctor what activities are safe for you. To help cramping and bloating:  Try walking around.  Put heat on your belly as told by your doctor. Use the heat source that your doctor recommends, such as a moist heat pack or a heating pad. ? Put a towel between your skin and the heat source. ? Leave the heat on for 20-30 minutes. ? Remove the heat if your skin turns bright red. This is very important if you are unable to feel pain, heat, or cold. You may have a greater risk of getting burned.   General instructions  If you were given a medicine to help you relax (sedative) during your procedure, it can affect you for many hours. Do not drive or use machinery until your doctor says that it is safe.  For the first 24 hours after the procedure: ? Do not sign important documents. ? Do not drink alcohol. ? Do your daily activities more slowly than normal. ? Eat foods that are soft and easy to digest.  Take  over-the-counter or prescription medicines only as told by your doctor.  Keep all follow-up visits as told by your doctor. This is important. Contact a doctor if:  You have blood in your poop 2-3 days after the procedure. Get help right away if:  You have more than a small amount of blood in your poop.  You see large clumps of tissue (blood clots) in your poop.  Your belly is swollen.  You feel like you may vomit (nauseous).  You vomit.  You have a fever.  You have belly pain that gets worse, and medicine does not help your pain. Summary  After the procedure, it is common to have a small amount of blood in your poop. You may also have mild cramping and bloating in your belly.  If you were given a medicine to help you relax (sedative) during your procedure, it can affect you for many hours. Do not drive or use machinery until your doctor says that it is safe.  Get help right away if you have a lot of blood in your poop, feel like you may vomit, have a fever, or have more belly pain. This information is not intended to replace advice given to you by your health care provider. Make sure you discuss any questions you have with your health care provider. Document Revised: 06/10/2019 Document Reviewed: 02/28/2019 Elsevier Patient Education  2021  [employer] Colon Polyps  Colon polyps are tissue growths inside the colon, which is part of the large intestine. They are one of the types of polyps that can grow in the body. A polyp may be a round bump or a mushroom-shaped growth. You could have one polyp or more than one. Most colon polyps are noncancerous (benign). However, some colon polyps can become cancerous over time. Finding and removing the polyps early can help prevent this. What are the causes? The exact cause of colon polyps is not known. What increases the risk? The following factors may make you more likely to develop this condition:  Having a family history of colorectal  cancer or colon polyps.  Being older than 47 years of age.  Being younger than 47 years of age and having a significant family history of colorectal cancer or colon polyps or a genetic condition that puts you at higher risk of getting colon polyps.  Having inflammatory bowel disease, such as ulcerative colitis or Crohn's disease.  Having certain conditions passed from parent to child (hereditary conditions), such as: ? Familial adenomatous polyposis (FAP). ? Lynch syndrome. ? Turcot syndrome. ? Peutz-Jeghers syndrome. ? MUTYH-associated polyposis (MAP).  Being overweight.  Certain lifestyle factors. These include smoking cigarettes, drinking too much alcohol, not getting enough exercise, and eating a diet that is high in fat and red meat and low in fiber.  Having had childhood cancer that was treated with radiation of the abdomen. What are the signs or symptoms? Many times, there are no symptoms. If you have symptoms, they may include:  Blood coming from the rectum during a bowel movement.  Blood in the stool (feces). The blood may be bright red or very dark in color.  Pain in the abdomen.  A change in bowel habits, such as constipation or diarrhea. How is this diagnosed? This condition is diagnosed with a colonoscopy. This is a procedure in which a lighted, flexible scope is inserted into the opening between the buttocks (anus) and then passed into the colon to examine the area. Polyps are sometimes found when a colonoscopy is done as part of routine cancer screening tests. How is this treated? This condition is treated by removing any polyps that are found. Most polyps can be removed during a colonoscopy. Those polyps will then be tested for cancer. Additional treatment may be needed depending on the results of testing. Follow these instructions at home: Eating and drinking  Eat foods that are high in fiber, such as fruits, vegetables, and whole grains.  Eat foods that are  high in calcium and vitamin D, such as milk, cheese, yogurt, eggs, liver, fish, and broccoli.  Limit foods that are high in fat, such as fried foods and desserts.  Limit the amount of red meat, precooked or cured meat, or other processed meat that you eat, such as hot dogs, sausages, bacon, or meat loaves.  Limit sugary drinks.   Lifestyle  Maintain a healthy weight, or lose weight if recommended by your health care provider.  Exercise every day or as told by your health care provider.  Do not use any products that contain nicotine or tobacco, such as cigarettes, e-cigarettes, and chewing tobacco. If you need help quitting, ask your health care provider.  Do not drink alcohol if: ? Your health care provider tells you not to drink. ? You are pregnant, may be pregnant, or are planning to become pregnant.  If you drink alcohol: ? Limit how much you  use to:  0-1 drink a day for women.  0-2 drinks a day for men. ? Know how much alcohol is in your drink. In the [place] one drink equals one 12 oz bottle of beer (355 mL), one 5 oz glass of wine (148 mL), or one 1 oz glass of hard liquor (44 mL). General instructions  Take over-the-counter and prescription medicines only as told by your health care provider.  Keep all follow-up visits. This is important. This includes having regularly scheduled colonoscopies. Talk to your health care provider about when you need a colonoscopy. Contact a health care provider if:  You have new or worsening bleeding during a bowel movement.  You have new or increased blood in your stool.  You have a change in bowel habits.  You lose weight for no known reason. Summary  Colon polyps are tissue growths inside the colon, which is part of the large intestine. They are one type of polyp that can grow in the body.  Most colon polyps are noncancerous (benign), but some can become cancerous over time.  This condition is diagnosed with a colonoscopy.  This  condition is treated by removing any polyps that are found. Most polyps can be removed during a colonoscopy. This information is not intended to replace advice given to you by your health care provider. Make sure you discuss any questions you have with your health care provider. Document Revised: 11/23/2019 Document Reviewed: 11/23/2019 Elsevier Patient Education  2021 [place] are being discharged to home.  Resume your previous diet.  We are waiting for your pathology results.  Your physician has recommended a repeat colonoscopy in three years for screening purposes given prep quality.

## 2020-11-13 NOTE — Op Note (Signed)
[facility] Patient Name: Howard Rogers Procedure Date: 11/13/2020 11:37 AM MRN: [id number] Date of Birth: 11/13/1975 Attending MD: Maylon Peppers ,  CSN: [id number] Age: 45 Admit Type: Outpatient Procedure:                Colonoscopy Indications:              Screening for colorectal malignant neoplasm Providers:                Maylon Peppers, Lambert Mody, Kristine L.                            Risa Grill, Technician Referring MD:              Medicines:                Monitored Anesthesia Care Complications:            No immediate complications. Estimated Blood Loss:     Estimated blood loss: none. Procedure:                Pre-Anesthesia Assessment:                           - Prior to the procedure, a History and Physical                            was performed, and patient medications, allergies                            and sensitivities were reviewed. The patient's                            tolerance of previous anesthesia was reviewed.                           - The risks and benefits of the procedure and the                            sedation options and risks were discussed with the                            patient. All questions were answered and informed                            consent was obtained.                           - After reviewing the risks and benefits, the                            patient was deemed in satisfactory condition to                            undergo the procedure.                           - ASA Grade Assessment: II - A patient with mild  systemic disease.                           After obtaining informed consent, the colonoscope                            was passed under direct vision. Throughout the                            procedure, the patient's blood pressure, pulse, and                            oxygen saturations were monitored continuously. The                             [id number]-HQ190L ([id number]) scope was introduced through                            the anus and advanced to the the cecum, identified                            by appendiceal orifice and ileocecal valve. The                            colonoscopy was performed without difficulty. The                            patient tolerated the procedure well. The quality                            of the bowel preparation was adequate to identify                            polyps 6 mm and larger in size. Scope In: 11:51:15 AM Scope Out: 12:10:50 PM Scope Withdrawal Time: 0 hours 14 minutes 36 seconds  Total Procedure Duration: 0 hours 19 minutes 35 seconds  Findings:      The perianal and digital rectal examinations were normal.      A 3 mm polyp was found in the sigmoid colon. The polyp was sessile. The       polyp was removed with a cold biopsy forceps. Resection and retrieval       were complete.      Non-bleeding internal hemorrhoids were found during retroflexion. The       hemorrhoids were small. Impression:               - One 3 mm polyp in the sigmoid colon, removed with                            a cold biopsy forceps. Resected and retrieved.                           - Non-bleeding internal hemorrhoids. Moderate Sedation:      Per Anesthesia Care Recommendation:           - Discharge patient to home (  ambulatory).                           - Resume previous diet.                           - Await pathology results.                           - Repeat colonoscopy in 3 years for screening                            purposes given prep quality. Procedure Code(s):        --- Professional ---                           [phone], Colonoscopy, flexible; with biopsy, single                            or multiple Diagnosis Code(s):        --- Professional ---                           Z12.11, Encounter for screening for malignant                            neoplasm of colon                            K63.5, Polyp of colon                           K64.8, Other hemorrhoids CPT copyright 2019 American [employer] Association. All rights reserved. The codes documented in this report are preliminary and upon coder review may  be revised to meet current compliance requirements. Maylon Peppers, MD Maylon Peppers,  11/13/2020 12:14:21 PM This report has been signed electronically. Number of Addenda: 0

## 2020-11-13 NOTE — Anesthesia Postprocedure Evaluation (Signed)
Anesthesia Post Note  Patient: Howard Rogers  Procedure(s) Performed: COLONOSCOPY WITH PROPOFOL (N/A ) POLYPECTOMY  Patient location during evaluation: Endoscopy Anesthesia Type: General Level of consciousness: awake and alert Pain management: pain level controlled Vital Signs Assessment: post-procedure vital signs reviewed and stable Respiratory status: spontaneous breathing, nonlabored ventilation, respiratory function stable and patient connected to nasal cannula oxygen Cardiovascular status: blood pressure returned to baseline and stable Postop Assessment: no apparent nausea or vomiting Anesthetic complications: no   No complications documented.   Last Vitals:  Vitals:   11/13/20 0902 11/13/20 1215  BP: 133/78 108/71  Pulse:  70  Resp: 12 16  Temp: 36.7 C 36.6 C  SpO2: 97% 95%    Last Pain:  Vitals:   11/13/20 1215  TempSrc: Oral  PainSc: 0-No pain                 Adair Patter Manjinder Breau

## 2020-11-14 LAB — SURGICAL PATHOLOGY

## 2020-11-15 ENCOUNTER — Encounter (INDEPENDENT_AMBULATORY_CARE_PROVIDER_SITE_OTHER): Payer: Self-pay | Admitting: *Deleted

## 2020-11-19 ENCOUNTER — Encounter (HOSPITAL_COMMUNITY): Payer: Self-pay | Admitting: Gastroenterology

## 2023-10-27 ENCOUNTER — Ambulatory Visit: Admitting: Urology
